# Patient Record
Sex: Male | Born: 1960 | Race: Black or African American | Hispanic: No | Marital: Married | Smoking: Never smoker
Health system: Southern US, Community
[De-identification: ages and names within clinical notes are randomized; demographics above are authoritative.]

## PROBLEM LIST (undated history)

## (undated) DIAGNOSIS — I10 Essential (primary) hypertension: Secondary | ICD-10-CM

## (undated) DIAGNOSIS — T7840XA Allergy, unspecified, initial encounter: Secondary | ICD-10-CM

## (undated) DIAGNOSIS — M1990 Unspecified osteoarthritis, unspecified site: Secondary | ICD-10-CM

## (undated) DIAGNOSIS — I83009 Varicose veins of unspecified lower extremity with ulcer of unspecified site: Secondary | ICD-10-CM

## (undated) DIAGNOSIS — G4733 Obstructive sleep apnea (adult) (pediatric): Secondary | ICD-10-CM

## (undated) DIAGNOSIS — E669 Obesity, unspecified: Secondary | ICD-10-CM

## (undated) DIAGNOSIS — L97909 Non-pressure chronic ulcer of unspecified part of unspecified lower leg with unspecified severity: Secondary | ICD-10-CM

## (undated) DIAGNOSIS — E785 Hyperlipidemia, unspecified: Secondary | ICD-10-CM

## (undated) HISTORY — DX: Allergy, unspecified, initial encounter: T78.40XA

## (undated) HISTORY — DX: Obstructive sleep apnea (adult) (pediatric): G47.33

## (undated) HISTORY — DX: Obesity, unspecified: E66.9

## (undated) HISTORY — DX: Varicose veins of unspecified lower extremity with ulcer of unspecified site: I83.009

## (undated) HISTORY — DX: Essential (primary) hypertension: I10

## (undated) HISTORY — DX: Unspecified osteoarthritis, unspecified site: M19.90

## (undated) HISTORY — PX: ROTATOR CUFF REPAIR: SHX139

## (undated) HISTORY — PX: SPINE SURGERY: SHX786

## (undated) HISTORY — PX: NECK SURGERY: SHX720

## (undated) HISTORY — DX: Hyperlipidemia, unspecified: E78.5

## (undated) HISTORY — DX: Non-pressure chronic ulcer of unspecified part of unspecified lower leg with unspecified severity: L97.909

---

## 2001-08-02 ENCOUNTER — Encounter: Payer: Self-pay | Admitting: *Deleted

## 2001-08-02 ENCOUNTER — Ambulatory Visit (HOSPITAL_COMMUNITY): Admission: RE | Admit: 2001-08-02 | Discharge: 2001-08-02 | Payer: Self-pay | Admitting: *Deleted

## 2002-03-20 ENCOUNTER — Ambulatory Visit (HOSPITAL_BASED_OUTPATIENT_CLINIC_OR_DEPARTMENT_OTHER): Admission: RE | Admit: 2002-03-20 | Discharge: 2002-03-20 | Payer: Self-pay | Admitting: *Deleted

## 2002-09-22 ENCOUNTER — Encounter: Payer: Self-pay | Admitting: Neurosurgery

## 2002-09-26 ENCOUNTER — Encounter: Payer: Self-pay | Admitting: Neurosurgery

## 2002-09-27 ENCOUNTER — Inpatient Hospital Stay (HOSPITAL_COMMUNITY): Admission: RE | Admit: 2002-09-27 | Discharge: 2002-09-29 | Payer: Self-pay | Admitting: Neurosurgery

## 2003-06-08 ENCOUNTER — Encounter: Payer: Self-pay | Admitting: *Deleted

## 2003-06-08 ENCOUNTER — Ambulatory Visit (HOSPITAL_COMMUNITY): Admission: RE | Admit: 2003-06-08 | Discharge: 2003-06-08 | Payer: Self-pay | Admitting: *Deleted

## 2009-07-22 ENCOUNTER — Ambulatory Visit: Payer: Self-pay | Admitting: Cardiovascular Disease

## 2009-07-26 ENCOUNTER — Telehealth: Payer: Self-pay | Admitting: Cardiovascular Disease

## 2009-09-05 ENCOUNTER — Ambulatory Visit: Payer: Self-pay | Admitting: Cardiovascular Disease

## 2009-09-18 ENCOUNTER — Encounter (INDEPENDENT_AMBULATORY_CARE_PROVIDER_SITE_OTHER): Payer: Self-pay | Admitting: *Deleted

## 2010-12-30 ENCOUNTER — Encounter: Payer: Self-pay | Admitting: Cardiovascular Disease

## 2011-03-17 NOTE — Letter (Signed)
July 22, 2009    Dr. Robert Bellow  [facility]  [address]  [place], [place] [address]   RE:  Kristopher Garcia, Kristopher Garcia  MRN:  [id number]  /  DOB:  July 22, 1961   Dear Dr. Perrin Maltese:   Thank you for the referral of your patient, Kristopher Garcia, to  Cardiology Clinic.  As you know he is a 50 year old male with  obstructive sleep apnea, morbid obesity, and poorly controlled  hypertension who recently saw you in clinic after not seeing a physician  for approximately 3 years' time.  The patient was found to have some EKG  changes compared with an EKG of 2006, and he was referred to our clinic.  Today in clinic the patient is still hypertensive at 173/105, although  he has only been taking his medications for approximately 2 days.  He is  not endorsing any symptoms consistent with angina.  However, he is very  inactive.  We discussed at length the lifestyle modifications including  medication compliance that will be necessary for him to improve his  cardiovascular status, and he seems willing to be an active participant  in these interventions.  Prior to instituting an exercise regimen, I  would like to order a treadmill stress test.  However, will need to wait  until his blood pressure is under better control to do so.  I have  instructed the patient to check his blood pressure twice a day for the  next week and to fax me these results.  If his blood pressure is within  a more acceptable range, we will order the stress test.  If not will  titrate the medications as necessary.  I look forward to following this  patient along with you, and I hope you are happy with this approach.  Please feel free to contact my office at any time with any questions or  concerns.    Sincerely,      Brayton El, MD  Electronically Signed    SGA/MedQ  DD: 07/22/2009  DT: 07/22/2009  Job #: [id number]

## 2011-03-17 NOTE — Assessment & Plan Note (Signed)
[facility] CARDIOLOGY OFFICE NOTE   NAME:Kristopher Garcia, Kristopher Garcia                     MRN:          [id number]  DATE:07/22/2009                            DOB:          08-31-1961    CHIEF COMPLAINT:  Hypertension.   HISTORY OF PRESENT ILLNESS:  Kristopher Garcia is a 50 year old black male  with past medical history significant for hypertension, obstructive  sleep apnea, morbid obesity, who is presenting for blood pressure  control and an abnormal EKG. The patient states for the past 3 years, he  has not seen a physician.  Recently, however, he took his blood pressure  and it was approximately 180/110.  This caused a great concern with the  patient and he saw a primary care physician, Dr. Perrin Maltese, last week in  clinic.  At that time, the patient was reinstituted on his previous  blood pressure medications as listed below.  Dr. Perrin Maltese noticed that the  patient had some changes in the contour of the ST segments in his EKG  compared with an EKG in 2006 and the patient was referred here for  further evaluation.  In speaking with the patient, he states that he is  not very active.  He jumps rope from time to time and can walk from his  vehicle to store and throughout the store without any difficulty.  He  does endorse very occasional twinges of chest discomfort that are not  associated with activity, are very brief, and are self-limiting.  He  denies any change in dyspnea on exertion in recent months.  The patient  does state he carries a history of obstructive sleep apnea and started  wearing his CPAP machine in the evenings for the past few days.  He  states a strong desire to become serious about addressing his health  issues.  He does endorse some lower extremity edema at the end of the  day.  He denies any PND, orthopnea, or syncope.   PAST MEDICAL HISTORY:  As above in HPI.  Also, the patient has a history  of surgery on his  C-spine.   SOCIAL HISTORY:  No tobacco.  No alcohol.  No drug use.  The patient  lives with his wife and two children, boy 41 and a girl 16.   FAMILY HISTORY:  Probably negative for premature coronary artery  disease, although he had a father who died at age 71 of an unknown  cause.   ALLERGIES:  No known drug allergies.   MEDICATIONS:  1. Norvasc 5 mg daily.  2. Benicar HCT 40/25 mg daily.  3. Multivitamin daily.  4. Vitamin D 2000 international units daily.  5. Coenzyme Q10 daily.   REVIEW OF SYSTEMS:  The patient endorses increased urination since  starting his antihypertensive medications.  He occasionally gets  soreness in his chest when lifting weights.  Other systems as in HPI,  otherwise negative.   PHYSICAL EXAMINATION:  VITAL SIGNS:  His blood pressure is 173/105  checked twice, pulse 87, and sating 97% on room air.  His weight is 335  pounds.  GENERAL:  No acute distress.  HEENT:  Normocephalic and atraumatic.  NECK:  Supple.  There is no JVD.  There are no carotid bruits.  HEART:  Regular rate and rhythm without murmur, rub, or gallop.  LUNGS:  Clear bilaterally.  ABDOMEN:  Soft and nontender.  EXTREMITIES:  Trace bilateral lower extremity edema.  Pulses 2+  bilateral carotid and radial pulses.  SKIN:  Warm and dry without rash.  NEUROLOGIC:  Nonfocal.  PSYCHIATRIC:  The patient is appropriate with normal levels of insight.  MUSCULOSKELETAL:  The patient has 5/5 bilateral upper and lower  extremity strength.   EKG from today independently interpreted by myself demonstrates normal  sinus rhythm with some baseline artifact.  There is T-wave flattening  and there is left ventricular hypertrophy.  Compared with EKG dated  July 19, 2009, T-wave is no longer inverted in the inferior leads.  An EKG dated October 2006 also shows inversion in the inferior leads.   Lab work is not currently available for review.  However, we have  requested the labs that were  drawn last week.   ASSESSMENT:  A 50 year old black male with poorly-controlled  hypertension, obstructive sleep apnea, and morbid obesity.   PLAN:  1. Hypertension.  The patient is currently not well controlled.      However, he has only been taking his medications for 2 days.  We      will ask the patient to keep a log of his blood pressures at home      checking it twice a day and fax [place] this log at the end of the week.      At that time, if he were need further antihypertensive control, we      would increase the Norvasc to 10 mg daily.  Of note, the patient      states that the Benicar HCT may be too expensive for him, therefore      when he runs out of his prescription at the end of the month, we      may consider switching him to lisinopril/hydrochlorothiazide      combination in its place.  2. Obstructive sleep apnea.  The patient is encouraged to be compliant      with his CPAP and is informed that as obstructive sleep apnea is      treated, his blood pressure should also improve.  3. EKG changes.  The patient does have EKG changes in the ST-segment      and T-waves in the inferior lateral leads.  At this point, this is      of unclear significance.  The patient is not having any clear      symptoms consistent with angina.  Because we would like to start      him on an exercise routine, I would like to order a stress test on      him.  We will await the results of his blood pressures over the      next week to decide on the timing of the stress test as his blood      pressure must be under better control before it is undertaken.  4. Obesity.  The patient is counseled regarding improve dietary      control.  Though he states that this is somewhat difficult as his      wife does the shopping and that the healthier foods tend to be more      expensive.  Once the patient  undergoes an exercise stress test, we      will encourage an exercise routine with him performing some type  of      aerobic activity for at least 30 minutes everyday of the week.  The      patient seems to be very receptive to instituting this regimen.  We      will await the patient's blood pressure readings over the next week      and determine the next appropriate step of his treatment based on      these.     Brayton El, MD  Electronically Signed    SGA/MedQ  DD: 07/22/2009  DT: 07/23/2009  Job #: [id number]

## 2011-03-17 NOTE — Assessment & Plan Note (Signed)
[facility] CARDIOLOGY OFFICE NOTE   NAME:Hirschman, CHRISANGEL ESKENAZI                     MRN:          [id number]  DATE:09/05/2009                            DOB:          June 11, 1961    PROBLEM LIST:  1. Poorly controlled hypertension.  2. Obesity.  3. Obstructive sleep apnea.   INTERVAL HISTORY:  Since his last visit, the patient underwent a  exercise echocardiogram that was poor quality study.  However, there was  no evidence of inducible ischemia.  Since that time, the patient states  he has been exercising almost on a daily basis for approximately 30  minutes at a time.  He thinks he has lost approximately 8 pounds.  During these episodes of exercise, he does not experience any chest  discomfort or significant amount of shortness of breath.  He ran out of  his Benicar/HCT approximately 2 weeks ago and is requesting for  medication as he is unable to afford it.  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   REVIEW OF SYSTEMS:  As above, otherwise negative.   PHYSICAL EXAMINATION:  VITAL SIGNS:  The patient has a blood pressure of  158/99, pulse is 74, he weighs 330 pounds, he is sating which is 4  pounds less than he weighed at the end of September, and he is sating  97% on room air.  GENERAL:  He is in no acute distress.  HEENT:  Nonfocal.  Normocephalic, atraumatic.  NECK:  Supple.  There is no JVD.  There are no carotid bruits.  HEART:  Regular rate and rhythm without murmur, rub, or gallop.  LUNGS:  Clear to auscultation bilaterally.  ABDOMEN:  Soft, nontender, nondistended.  EXTREMITIES:  Have a trace without bilateral lower extremity edema.  PSYCHIATRIC:  The patient is appropriate with normal levels of insight  and review of the patient's dobutamine echocardiogram showed an ejection  fraction that was preserved.  He only completed 1 minute into Bruce  protocol, however, he states he could have gone further he was stopped  as his heart rate reached a 5% maximal predicted heart rate within that  1 minute.  The patient also had a hypertensive blood pressure response.   ASSESSMENT AND PLAN:  1. Hypertension.  He should continue on Norvasc 10 mg daily.  We will      start him on HCTZ/lisinopril 12.5/20 mg daily.  He has really been      written a prescription for this medication by his primary care      physician, who was also following up with a BMP.  2. Obstructive sleep apnea.  The patient should continue on CPAP.  3. Obesity.  The patient should continue with his exercise routine.      He has been successful in      losing weight and he had loosed.  The patient also make efforts to      improve his diet and to      intake fewer calories.  We will see the patient back  in 4 months'      time unless a problem were to arise in the interim.     Brayton El, MD  Electronically Signed    SGA/MedQ  DD: 09/05/2009  DT: 09/06/2009  Job #: [id number]

## 2011-03-20 NOTE — Letter (Signed)
August 16, 2009    Mr. Kristopher Garcia  [address]  [place], [place]  [address]   RE:  Kristopher Garcia, Kristopher Garcia  MRN:  [id number]  /  DOB:  10/21/1961   Dear [name]:   We have been unable to reach by phone.  I am writing to you regarding  the results of your stress test performed on August 01, 2009.  Your  heart's pumping function was completely within normal limits and it  appeared to improve appropriately with exercise.  Your blood pressure,  however, was elevated during exercise.  I would like to discuss further  blood pressure treatment with you as elevated blood pressure is a  significant risk factor for developing coronary disease in the future.  Please contact our office at [phone] at your earliest convenience.    Sincerely,      Brayton El, MD  Electronically Signed    SGA/MedQ  DD: 08/16/2009  DT: 08/16/2009  Job #: [id number]

## 2011-03-20 NOTE — Op Note (Signed)
NAME:  Kristopher Garcia, Kristopher Garcia                        ACCOUNT NO.:  [id number]   MEDICAL RECORD NO.:  [id number]                   PATIENT TYPE:  INP   LOCATION:  NA                                   FACILITY:  [facility]   PHYSICIAN:  Clydene Fake, M.D.               DATE OF BIRTH:  04/30/1961   DATE OF PROCEDURE:  09/26/2002  DATE OF DISCHARGE:                                 OPERATIVE REPORT   PREOPERATIVE DIAGNOSES:  Cervical spondylosis, cord compression, and  myelopathy.   POSTOPERATIVE DIAGNOSES:  Cervical spondylosis, cord compression, and  myelopathy.   PROCEDURES:  Intracervical corpectomy C5 and C6 with an anterior cervical  decompression and diskectomy and fusion of C3 through C4; planned fusion  from C3 through C7 with SynMesh cage at C4 through C7 with autograft bone  and structural allograft bone at C3-4 with a Premier anterior cervical plate  from C3 through C7; microdissection with microscope.   SURGEON:  Clydene Fake, M.D.   ASSISTANT:  Hewitt Shorts, M.D.   ANESTHESIA:  General endotracheal anesthesia.   ESTIMATED BLOOD LOSS:  400 cc   BLOOD REPLACED:  None.   DRAINS:  None.   COMPLICATIONS:  None.   REASON FOR PROCEDURE:  The patient is a 50 year old head wound over the last  year and on and off neck pain, but lately he has had a lot more.  The pain  in his neck extends into the right shoulder and sometimes down the right arm  at the deltoid area and triceps.  He does have some increased pain with  extension.  The MRI was done showing severe spinal cord compression; he has  some canal stenosis of the cervical spine at multiple levels.  The patient  was found to be mildly myopathic on exam, complete clonus and a little  trouble with tandem gait, and a little slowed right deltoid movements.  The  patient admitted for cervical decompression and cervical corpectomies.   PROCEDURE IN DETAIL:  The patient was brought to the operating room, general  anesthesia was induced, and the patient placed in Gardner-Wells traction  tongs for cervical traction; 10 pounds of traction was used.  The neck was  prepped and draped in the usual sterile fashion.  An incision was then made  over the anterior border of the sternocleidomastoid muscle after injecting  the area with 10 cc of 1% lidocaine with epinephrine.  The incision was  taken to the platysma; hemostasis was obtained with Bovie cauterization.  The platysma incised with the Bovie and blunt dissection was taken through  the anterior cervical fascia to the anterior cervical spine.  A needle was  placed in a disk space and x-ray done showing this was the 4-5 interspace.  We then continued our dissection so that 5-6 and 6-7 spaces were exposed  along with 3-4 disk spaces.  The longus colli muscle was reflected  bilaterally to each side to a distal length and self-retaining retractor  system was placed and centered over the dissected disk space.  Distraction  pins were placed in C4 and C7, and the disk spaces of C4-5, C5-6, and C6-7  were incised with a #15 blade and partial diskectomy was performed with  pituitary rongeurs.  The distraction pins were placed and the spine was  distracted over this length from C4 to C7.  Then we continued with  diskectomies with pituitary rongeurs and then corpectomies of C5 and C6  areas were performed with Leksell rongeurs and osteophyte reamers and to  remove the bulk of the intervertebral body.  All bone was shaved, cleaned,  and chopped into small pieces for use later in the case.  The microscope was  brought in for microdissection.  A high-speed drill was used to drill and  straighten out the lateral margins and to remove all the bone down to the  remnant of the posterior cortex and ligaments posteriorly.  At this point  curettes were used to remove the rest of the disk material in the disk  spaces and to remove the cartilaginous endplates from the C4 and  C7  vertebral bodies.  Then 1 to 2-mm Kerrison punches were then used to remove  the rest of the disk material with some posterior osteophytes at posterior  ligament throughout from C4 through C7, decompressing the spinal canal.  Bilateral foraminotomies were performed at each level and decompression of  the nerve roots as they came out. When we were finished, we had good  decompression of the central sac and bilateral foraminotomies done at 4-5, 5-  6, and 6-7.  A high-speed drill was used to remove the cartilaginous  endplates at C4 and C7, and then the depth of intervertebral bodies were  measured and found to be 16 mm deep.  We then measured the distance from C4  to C7 with a caliper and cut a SynMesh 12-mm cage to the appropriate length,  put the endplates on, and then packed the cage with the autograft bone  removed from the intervertebral bodies.  We then tapped this cage into  place, countersinking it a couple of mm; there was plenty of room posterior  between the cage and the spinal cord and dura.  We then released the  distraction and removed the distraction pin from the C7, and then tested the  cage again; it was firmly in place.  We removed the self-retraining  retractor up to near the C3-4 level and we placed a distraction pin up in  the C3 and distracted it over the 3-4 interspace after incising the disk  space.  Diskectomy was performed with pituitary rongeurs and curettes.  A  high-speed drill was used to perform the diskectomy and remove the  cartilaginous endplate.  22-mm Kerrison punches were used to remove disk  herniation and to remove posterior osteophytes and decompress the spinal  cord, along with bilateral foraminotomies.  When we had good decompression,  the wound was irrigated a antibiotic solution. The intervertebral bodies  were measured with a depth guage and we put a 6 mm Tutogen bone which was the depth shorter than the depth of the intervertebral bodies; it  was then  tapped into place and then countersunk a couple of mm. We tested with the  nerve hook behind the graft and there was plenty of room between graft and  dura.  Distraction pins were removed.  Hemostasis was  obtained with Gelfoam  and thrombin.  Gelfoam and thrombin was then removed.  Premier anterior  cervical plates were then placed over the cervical spine and two screws  placed in the C7, two into C4, and two into C3.  Lateral x-ray was obtained  showing good position of the plate and the screws at C3 and C4; we could not  see the screws at C7. We had a good bone graft at 3-4 and the cage looked in  good position at the superior aspect of it, but could not see the cage as  the butt at C7 due to large shoulders and large biceps of the patient.  Intraoperatively, the position images looked fine.  The wound was irrigated  with antibiotic solution.  The locking mechanism was then pushed up in the  appropriate position and all the screws tightened.  With good hemostasis,  the wound was irrigated with antibiotic solution again, and the platysma was  closed with 3-0 Vicryl interrupted suture, the subcutaneous tissue was  closed with the same and the skin was closed with Benzoin and Steri-Strips.  Dressing was placed.  The patient was placed into an Aspen cervical collar,  awakened from anesthesia, and transferred to the recovery room in stable  condition.                                               Clydene Fake, M.D.    JRH/MEDQ  D:  09/26/2002  T:  09/27/2002  Job:  [id number]

## 2011-03-20 NOTE — Discharge Summary (Signed)
NAME:  Kristopher Garcia, Kristopher Garcia NO.:  [id number]   MEDICAL RECORD NO.:  [id number]                   PATIENT TYPE:  INP   LOCATION:  3028                                 FACILITY:  [facility]   PHYSICIAN:  Clydene Fake, M.D.               DATE OF BIRTH:  [birth year]   DATE OF ADMISSION:  09/27/2002  DATE OF DISCHARGE:  09/29/2002                                 DISCHARGE SUMMARY   ADMISSION DIAGNOSES:  Cervical spondylosis, cord compression, myelopathy.   DISCHARGE DIAGNOSIS:  Cervical spondylosis, cord compression, myelopathy.   PROCEDURES:  Cervical corpectomy at C5 and C6 and anterior cervical  decompression and diskectomy at C4 with fusion of C3 through C7 with cage  from 4-7 and allograft of bone at C3-4 with autograft bone and placement of  sintered mesh from anterior incision, anterior cervical plate at C3 and  microscope for microdissection.   REASON FOR ADMISSION:  The patient is a 50 year old gentleman who has had on  and off neck pain over the last year or two, but over the last few months,  it has been warm with pain extending to the right shoulder and down the  right arm into the deltoid and triceps area.  He was found to have trouble  with tandem gait and smooth rapid alternating movements.  MRI was done  showing significant spondylosis, cord compression from cervical stenosis.  The patient is brought in for decompression.   HOSPITAL COURSE:  The patient was admitted on the day of surgery and  underwent the procedure noted above without complications.  Postoperatively,  the patient was transferred to recovery and then to stepdown intensive care  unit.  He had done well.  He was moving all four extremities well.  Voice  was okay.  Incision remained clean, dry, and intact.  He started eating.  He  was transferred to the floor where he continued to do well, started  ambulating.  He had a slight increase in his trouble with swallowing, but he  continued to progress.  On 09/29/2002, it was felt the patient may have a  little improvement in rapid alternating movements but definitely no  worsening in neurologic function.  He is able to eat, has a little  difficulty swallowing large things, but that is to be expected.  Incision is  healing well.  The patient will be discharged home in stable condition.   DISCHARGE MEDICATIONS:  As pre-hospitalization plus Vicodin ES 1 to 2 p.o.  q.4-6h. p.r.n. and Flexeril p.r.n. spasms.   DIET:  As tolerated.   FOLLOW UP:  Followup will be in three weeks in my office.  Clydene Fake, M.D.    JRH/MEDQ  D:  09/29/2002  T:  09/29/2002  Job:  [id number]

## 2011-04-15 ENCOUNTER — Encounter: Payer: Self-pay | Admitting: Cardiovascular Disease

## 2012-06-14 ENCOUNTER — Other Ambulatory Visit: Payer: Self-pay | Admitting: Cardiology

## 2012-06-16 NOTE — H&P (Signed)
Office Visit     Patient: Kristopher Garcia, Kristopher Garcia Provider: Cynthia Garcia. Ferguson, NP  DOB: 02/08/1961   Age: 51 Y   Sex: Male Date: 06/13/2012  Phone: [phone]  Address: [address] , [place], [place]-[address]  Pcp: ROBERT READE    --------------------------------------------------------------------------------  Subjective:    CC:      1. TT/abnormal Stress test f/u.      HPI:     General:           Mr Vandermeulen is Garcia 51 yo male followed by Dr Turner with Garcia hx of evaluation of abnormal EKG and to get cardiac clearance for right shoulder surgery. He is going to have right shoulder surgery and went in for preop clearance and was noted to have some nonspecific T wave changes. He says that he was told in the past that his EKG was abnormal and actually was seen by [facility] and had Garcia stress test and was normal. Repeat nuclear stress test 06/10/12 was abnormal with apical ischemia. He denies any chest pain, SOB, palpitations, dizziness or syncope. He occasionally will get some LE edema due to varicose veins especially when standing alot. He wears support hose to help. He walks on his job and says that he can walk several blocks with no symptoms..         Patient denies chest pain, palpitations, dizziness, syncope, nor PND.     ROS:      as noted in HPI, + chronic right shoulder pain since last October no GI complaints no black or bloody BMS, no abdominal pain, no fever, chills, appetite stable, no neurological changes, no allergy to seafood nor IVP dye.     Medical History: HTN, OA, various jts, Obesity, OSA, Dyslipidemia, obstructive sleep apnea (SPLIT 05/06/12 ESS 8, AHI 19/hr REM 46/hr, RDI 29/hr REM 60/hr, O2 min 77%; APAP 9-17 recommended).      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      Hospitalization/Major Diagnostic Procedure: see above .      Family History:  Father: deceased heart attack(age54) Mother: deceased  stroke(age 60), lung cancer, diabetes Paternal Grand Father: deceased Paternal Grand Mother: deceased Maternal Grand Father: deceased Maternal Grand Mother: deceased diabetes      Social History:      General: History of smoking  cigarettes:  Never smoked. no Smoking. no Alcohol. no Recreational drug use. no Exercise. Marital Status: married. Children: Jonovan & Victoria. Religion: yes, Non Denominational. Seat belt use: yes.      Medications: Amlodipine Besylate 10 MG Tablet 1 tablet Once Garcia day, Tekturna HCT 300-25 MG Tablet 1 tablet Once Garcia day, Percocet 5-325 MG Tablet 1 tablet every 6 hrs prn pain, Medication List reviewed and reconciled with the patient     Allergies: Latex Gloves: itchy hands.      Objective:    Vitals: Wt 300.6, Wt change -4.6 lb, Ht 72.75, BMI 39.93, Pulse sitting 78, BP sitting 130/86.     Examination:     Cardiology, General:         GENERAL APPEARANCE: pleasant, NAD.  HEENT: unremarkable.  CAROTID UPSTROKE: normal, no bruit.  JVD: flat.  HEART SOUNDS: regular, normal S1, S2, no S3 or S4.  MURMUR: absent.  LUNGS: no rales or wheezes.  ABDOMEN: soft, non tender, positive bowel sounds, no masses felt.  EXTREMITIES: no leg edema.  PERIPHERAL PULSES: 2 plus bilateral.              Assessment:    Assessment:  1. Essential hypertension, benign - 401.1 (Primary)   2. Encounter for pre-operative cardiovascular clearance - V72.81   3. Abnormal cardiovascular stress test - 794.39     Plan:    1. Essential hypertension, benign  Continue Amlodipine Besylate Tablet, 10 MG, 1 tablet, Orally, Once Garcia day ;  Continue Tekturna HCT Tablet, 300-25 MG, 1 tablet, Orally, Once Garcia day .       2. Abnormal cardiovascular stress test        LAB: Basic Metabolic     GLUCOSE 91 70-99 - mg/dL        BUN 16 6-26 - mg/dL        CREATININE 1.25 0.60-1.30 - mg/dl        eGFR (NON-AFRICAN AMERICAN) 61 >60 - calc        eGFR (AFRICAN AMERICAN) 73 >60 - calc        SODIUM 140 136-145 - mmol/L          POTASSIUM 3.6 3.5-5.5 - mmol/L        CHLORIDE 102 98-107 - mmol/L        C02 32 22-32 - mg/dL        ANION GAP 9.7 6.0-20.0 - mmol/L        CALCIUM 9.8 8.6-10.3 - mg/dL               FERGUSON,CYNTHIA Garcia 06/13/2012 06:20:09 PM > ok for cath        LAB: CBC with Diff      WBC 3.6 4.0-11.0 - K/ul L       RBC 4.59 4.20-5.80 - M/uL        HGB 13.8 13.0-17.0 - g/dL        HCT 42.6 39.0-52.0 - %        MCH 30.0 27.0-33.0 - pg        MPV 7.5 7.5-10.7 - fL        MCV 92.8 80.0-94.0 - fL        MCHC 32.3 32.0-36.0 - g/dL        RDW 14.4 11.5-15.5 - %        NRBC# 0.01 -        PLT 225 150-400 - K/uL        NEUT % 45.9 43.3-71.9 - %        NRBC% 0.30 - %        LYMPH% 38.2 16.8-43.5 - %        MONO % 9.6 4.6-12.4 - %        EOS % 5.3 0.0-7.8 - %        BASO % 1.0 0.0-1.0 - %         NEUT # 1.6 1.9-7.2 - K/uL L       LYMPH# 1.40 1.10-2.70 - K/uL        MONO # 0.3 0.3-0.8 - K/uL        EOS # 0.2 0.0-0.6 - K/uL        BASO # 0.0 0.0-0.1 - K/uL               FERGUSON,CYNTHIA Garcia 06/13/2012 05:53:32 PM > ok for cath        LAB: PT and PTT (020321)     aPTT 28 24-33 - SEC        INR 1.0 0.8-1.2 -        Prothrombin Time 10.8 9.1-12.0 - SEC                 FERGUSON,CYNTHIA Garcia 06/14/2012 08:33:24 AM > ok for cath   Risks and benefits of cardiac catheterization have been reviewed including risk of stroke, heart attack, death, bleeding, renal impariment and arterial damage. There was ample oppurtuny to answer questions. Alternatives were discussed. Patient understands and wishes to proceed. Patient aggreable and will arrange cardiac cath with Dr Skains hopefully by Radial approach.          Immunizations:       Labs:      Procedure Codes: 80048 ECL BMP, 85025 ECL CBC PLATELET DIFF, 36415 BLOOD COLLECTION ROUTINE VENIPUNCTURE     Preventive:           Follow Up: TT post cath (Reason: S/P cath)        Provider: Cynthia Garcia. Ferguson, NP  Patient: Kristopher Garcia, Emett Garcia  DOB: 04/27/1961   Date: 06/13/2012   

## 2012-06-17 ENCOUNTER — Encounter (HOSPITAL_COMMUNITY): Payer: Self-pay | Admitting: Pharmacy Technician

## 2012-06-21 ENCOUNTER — Encounter (HOSPITAL_COMMUNITY)
Admission: RE | Disposition: A | Discharge status: Home / Self Care | Payer: Self-pay | Source: Ambulatory Visit | Attending: Cardiology

## 2012-06-21 ENCOUNTER — Ambulatory Visit (HOSPITAL_COMMUNITY)
Admission: RE | Admit: 2012-06-21 | Discharge: 2012-06-21 | Disposition: A | Discharge status: Home / Self Care | Payer: BC Managed Care – PPO | Source: Ambulatory Visit | Attending: Cardiology | Admitting: Cardiology

## 2012-06-21 DIAGNOSIS — R9439 Abnormal result of other cardiovascular function study: Secondary | ICD-10-CM | POA: Insufficient documentation

## 2012-06-21 DIAGNOSIS — Z01810 Encounter for preprocedural cardiovascular examination: Secondary | ICD-10-CM | POA: Insufficient documentation

## 2012-06-21 DIAGNOSIS — M25519 Pain in unspecified shoulder: Secondary | ICD-10-CM | POA: Insufficient documentation

## 2012-06-21 DIAGNOSIS — I10 Essential (primary) hypertension: Secondary | ICD-10-CM | POA: Insufficient documentation

## 2012-06-21 HISTORY — PX: LEFT HEART CATHETERIZATION WITH CORONARY ANGIOGRAM: SHX5451

## 2012-06-21 HISTORY — PX: CARDIAC CATHETERIZATION: SHX172

## 2012-06-21 SURGERY — LEFT HEART CATHETERIZATION WITH CORONARY ANGIOGRAM
Anesthesia: LOCAL

## 2012-06-21 MED ORDER — HEPARIN SODIUM (PORCINE) 1000 UNIT/ML IJ SOLN
INTRAMUSCULAR | Status: AC
  Filled 2012-06-21: qty 1

## 2012-06-21 MED ORDER — SODIUM CHLORIDE 0.9 % IJ SOLN: 3.0000 mL | INTRAMUSCULAR | Status: DC | PRN

## 2012-06-21 MED ORDER — SODIUM CHLORIDE 0.9 % IV SOLN: 250.0000 mL | INTRAVENOUS | Status: DC | PRN

## 2012-06-21 MED ORDER — NITROGLYCERIN 0.2 MG/ML ON CALL CATH LAB
INTRAVENOUS | Status: AC
  Filled 2012-06-21: qty 1

## 2012-06-21 MED ORDER — VERAPAMIL HCL 2.5 MG/ML IV SOLN
INTRAVENOUS | Status: AC
  Filled 2012-06-21: qty 2

## 2012-06-21 MED ORDER — ONDANSETRON HCL 4 MG/2ML IJ SOLN: 4.0000 mg | Freq: Four times a day (QID) | INTRAMUSCULAR | Status: DC | PRN

## 2012-06-21 MED ORDER — DIAZEPAM 5 MG PO TABS
5.0000 mg | ORAL_TABLET | ORAL | Status: AC
  Administered 2012-06-21: 5 mg via ORAL
  Filled 2012-06-21: qty 1

## 2012-06-21 MED ORDER — FENTANYL CITRATE 0.05 MG/ML IJ SOLN
INTRAMUSCULAR | Status: AC
  Filled 2012-06-21: qty 2

## 2012-06-21 MED ORDER — MIDAZOLAM HCL 2 MG/2ML IJ SOLN
INTRAMUSCULAR | Status: AC
  Filled 2012-06-21: qty 2

## 2012-06-21 MED ORDER — SODIUM CHLORIDE 0.9 % IJ SOLN: 3.0000 mL | Freq: Two times a day (BID) | INTRAMUSCULAR | Status: DC

## 2012-06-21 MED ORDER — SODIUM CHLORIDE 0.9 % IV SOLN
INTRAVENOUS | Status: DC
  Administered 2012-06-21: 1000 mL via INTRAVENOUS

## 2012-06-21 MED ORDER — ASPIRIN 81 MG PO CHEW
324.0000 mg | CHEWABLE_TABLET | ORAL | Status: AC
  Administered 2012-06-21: 324 mg via ORAL
  Filled 2012-06-21: qty 4

## 2012-06-21 MED ORDER — LIDOCAINE HCL (PF) 1 % IJ SOLN
INTRAMUSCULAR | Status: AC
  Filled 2012-06-21: qty 30

## 2012-06-21 MED ORDER — ACETAMINOPHEN 325 MG PO TABS: 650.0000 mg | ORAL_TABLET | ORAL | Status: DC | PRN

## 2012-06-21 MED ORDER — SODIUM CHLORIDE 0.9 % IV SOLN
1.0000 mL/kg/h | INTRAVENOUS | Status: AC
Start: 2012-06-21 — End: 2012-06-21
  Administered 2012-06-21: 1 mL/kg/h via INTRAVENOUS

## 2012-06-21 MED ORDER — HEPARIN (PORCINE) IN NACL 2-0.9 UNIT/ML-% IJ SOLN
INTRAMUSCULAR | Status: AC
  Filled 2012-06-21: qty 2000

## 2012-06-21 NOTE — H&P (View-Only) (Signed)
Office Visit     Patient: Kristopher Garcia, Kristopher Garcia Provider: Michaell Cowing. Emelda Fear, NP  DOB: 09-Dec-1960   Age: 52 Y   Sex: Male Date: 06/13/2012  Phone: [phone]  Address: [address] , [place], [id number]  Pcp: ROBERT READE    --------------------------------------------------------------------------------  Subjective:    CC:      1. TT/abnormal Stress test f/u.      HPI:     General:           Mr Herda is a 51 yo male followed by Dr Mayford Knife with a hx of evaluation of abnormal EKG and to get cardiac clearance for right shoulder surgery. He is going to have right shoulder surgery and went in for preop clearance and was noted to have some nonspecific T wave changes. He says that he was told in the past that his EKG was abnormal and actually was seen by a Cardiology and had a stress test and was normal. Repeat nuclear stress test 06/10/12 was abnormal with apical ischemia. He denies any chest pain, SOB, palpitations, dizziness or syncope. He occasionally will get some LE edema due to varicose veins especially when standing alot. He wears support hose to help. He walks on his job and says that he can walk several blocks with no symptoms..         Patient denies chest pain, palpitations, dizziness, syncope, nor PND.     ROS:      as noted in HPI, + chronic right shoulder pain since last October no GI complaints no black or bloody BMS, no abdominal pain, no fever, chills, appetite stable, no neurological changes, no allergy to seafood nor IVP dye.     Medical History: HTN, OA, various jts, Obesity, OSA, Dyslipidemia, obstructive sleep apnea (SPLIT 05/06/12 ESS 8, AHI 19/hr REM 46/hr, RDI 29/hr REM 60/hr, O2 min 77%; APAP 9-17 recommended).      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      Hospitalization/Major Diagnostic Procedure: see above .      Family History:  Father: deceased heart attack(age54) Mother: deceased  stroke(age 60), lung cancer, diabetes Paternal Grand Father: deceased Paternal Grand Mother: deceased Maternal Grand Father: deceased Maternal Grand Mother: deceased diabetes      Social History:      General: History of smoking  cigarettes:  Never smoked. no Smoking. no Alcohol. no Recreational drug use. no Exercise. Marital Status: married. Children: Cyd Silence. Religion: yes, Non Denominational. Seat belt use: yes.      Medications: Amlodipine Besylate 10 MG Tablet 1 tablet Once a day, Tekturna HCT 300-25 MG Tablet 1 tablet Once a day, Percocet 5-325 MG Tablet 1 tablet every 6 hrs prn pain, Medication List reviewed and reconciled with the patient     Allergies: Latex Gloves: itchy hands.      Objective:    Vitals: Wt 300.6, Wt change -4.6 lb, Ht 72.75, BMI 39.93, Pulse sitting 78, BP sitting 130/86.     Examination:     Cardiology, General:         GENERAL APPEARANCE: pleasant, NAD.  HEENT: unremarkable.  CAROTID UPSTROKE: normal, no bruit.  JVD: flat.  HEART SOUNDS: regular, normal S1, S2, no S3 or S4.  MURMUR: absent.  LUNGS: no rales or wheezes.  ABDOMEN: soft, non tender, positive bowel sounds, no masses felt.  EXTREMITIES: no leg edema.  PERIPHERAL PULSES: 2 plus bilateral.  Assessment:    Assessment:  1. Essential hypertension, benign - 401.1 (Primary)   2. Encounter for pre-operative cardiovascular clearance - V72.81   3. Abnormal cardiovascular stress test - 794.39     Plan:    1. Essential hypertension, benign  Continue Amlodipine Besylate Tablet, 10 MG, 1 tablet, Orally, Once a day ;  Continue Tekturna HCT Tablet, 300-25 MG, 1 tablet, Orally, Once a day .       2. Abnormal cardiovascular stress test        LAB: Basic Metabolic     GLUCOSE 91 70-99 - mg/dL        BUN 16 2-95 - mg/dL        CREATININE 6.21 0.60-1.30 - mg/dl        eGFR (NON-AFRICAN AMERICAN) 61 >60 - calc        eGFR (AFRICAN AMERICAN) 73 >60 - calc        SODIUM 140 136-145 - mmol/L          POTASSIUM 3.6 3.5-5.5 - mmol/L        CHLORIDE 102 98-107 - mmol/L        C02 32 22-32 - mg/dL        ANION GAP 9.7 3.0-86.5 - mmol/L        CALCIUM 9.8 8.6-10.3 - mg/dL               FERGUSON,CYNTHIA A 06/13/2012 06:20:09 PM > ok for cath        LAB: CBC with Diff      WBC 3.6 4.0-11.0 - K/ul L       RBC 4.59 4.20-5.80 - M/uL        HGB 13.8 13.0-17.0 - g/dL        HCT 78.4 69.6-29.5 - %        MCH 30.0 27.0-33.0 - pg        MPV 7.5 7.5-10.7 - fL        MCV 92.8 80.0-94.0 - fL        MCHC 32.3 32.0-36.0 - g/dL        RDW 28.4 13.2-44.0 - %        NRBC# 0.01 -        PLT 225 150-400 - K/uL        NEUT % 45.9 43.3-71.9 - %        NRBC% 0.30 - %        LYMPH% 38.2 16.8-43.5 - %        MONO % 9.6 4.6-12.4 - %        EOS % 5.3 0.0-7.8 - %        BASO % 1.0 0.0-1.0 - %         NEUT # 1.6 1.9-7.2 - K/uL L       LYMPH# 1.40 1.10-2.70 - K/uL        MONO # 0.3 0.3-0.8 - K/uL        EOS # 0.2 0.0-0.6 - K/uL        BASO # 0.0 0.0-0.1 - K/uL               FERGUSON,CYNTHIA A 06/13/2012 05:53:32 PM > ok for cath        LAB: PT and PTT ([id number])     aPTT 28 24-33 - SEC        INR 1.0 0.8-1.2 -        Prothrombin Time 10.8 9.1-12.0 - SEC  FERGUSON,CYNTHIA A 06/14/2012 08:33:24 AM > ok for cath   Risks and benefits of cardiac catheterization have been reviewed including risk of stroke, heart attack, death, bleeding, renal impariment and arterial damage. There was ample oppurtuny to answer questions. Alternatives were discussed. Patient understands and wishes to proceed. Patient aggreable and will arrange cardiac cath with Dr Anne Fu hopefully by Radial approach.          Immunizations:       Labs:      Procedure Codes: 19147 ECL BMP, 85025 ECL CBC PLATELET DIFF, 82956 BLOOD COLLECTION ROUTINE VENIPUNCTURE     Preventive:           Follow Up: TT post cath (Reason: S/P cath)        Provider: Michaell Cowing. Emelda Fear, NP  Patient: Kristopher Garcia, Kristopher Garcia  DOB: Jan 04, 1961   Date: 06/13/2012

## 2012-06-21 NOTE — CV Procedure (Signed)
CARDIAC CATHETERIZATION  PROCEDURE:  Left heart catheterization with selective coronary angiography, left ventriculogram via the radial artery approach.  INDICATIONS:  51 year old male with upcoming right shoulder surgery who underwent nuclear stress test by Dr. Mayford Knife who showed apical reversible defect, possible soft tissue attenuation, EKG with T wave inversions. Catheterization done to exclude ischemia.  The risks, benefits, and details of the procedure were explained to the patient, including possibilities of stroke, heart attack, death, renal impairment, arterial damage, bleeding.  The patient verbalized understanding and wanted to proceed.  Informed written consent was obtained.  PROCEDURE TECHNIQUE:  Allen's test was performed pre-and post procedure and was normal. The right radial artery site was prepped and draped in a sterile fashion. One percent lidocaine was used for local anesthesia. Using the modified Seldinger technique a 5 French hydrophilic sheath was inserted into the radial artery without difficulty. 3 mg of verapamil was administered via the sheath. A Judkins right #4 catheter with the guidance of a Versicore wire was placed in the right coronary cusp and it was difficult to obtain the ostium of the RCA.. A no torque catheter was also utilized unsuccessful. An EZ RAD right catheter was the closest at cannulating and provided adequate visualization nonselectively. His arch was challenging to traverse and deep breathing was necessary to extend catheters. Difficult to cannulate arteries.  After traversing the aortic arch, 5000 units of heparin IV was administered. A Judkins left #3.5 catheter was used to selectively cannulate the left main artery. Multiple views with hand injection of Omnipaque were obtained. Catheter a pigtail catheter was used to cross into the left ventricle, hemodynamics were obtained, and a left ventriculogram was performed in the RAO position with power injection.   200 mcg of intra-sheath nitroglycerin was administered prior to sheath pull. Following the procedure, sheath was removed, patient was hemodynamically stable, hemostasis was maintained with a Terumo T band.   CONTRAST:  Total of 235 ml.    FLOUROSCOPY TIME: 17.6 min.  COMPLICATIONS:  None.    HEMODYNAMICS:  Aortic pressure was 145/87 mmHg; LV systolic pressure was ; LVEDP .  There was no gradient between the left ventricle and aorta.    ANGIOGRAPHIC DATA:    Left main: No angiographically significant disease dominant vessel.  Left anterior descending (LAD): Vessel has one significant diagonal branch, wraps around the apex and supplies the last one third of the inferior wall.  Circumflex artery (CIRC): 3 obtuse marginal branches. No significant abnormalities. Dominant vessel giving rise to posterior descending artery.  Right coronary artery (RCA): Small, nondominant vessel. Nonselective reviewed which shows no evidence of proximal vessel disease or mid vessel disease.  LEFT VENTRICULOGRAM:  Left ventricular angiogram was done in the 30 RAO projection and revealed normal left ventricular wall motion and systolic function with an estimated ejection fraction of 65%.   IMPRESSIONS:  No angiographically significant CAD Normal left ventricular systolic function.  LVEDP 23  mmHg.  Ejection fraction 65%.  RECOMMENDATION:  Reassuring. He may proceed with shoulder surgery.

## 2012-06-21 NOTE — Interval H&P Note (Signed)
History and Physical Interval Note:  06/21/2012 9:00 AM  Kristopher Garcia  has presented today for surgery, with the diagnosis of Chest pain  The various methods of treatment have been discussed with the patient and family. After consideration of risks, benefits and other options for treatment, the patient has consented to  Procedure(s) (LRB): LEFT HEART CATHETERIZATION WITH CORONARY ANGIOGRAM (N/A) as a surgical intervention .  The patient's history has been reviewed, patient examined, no change in status, stable for surgery.  I have reviewed the patient's chart and labs.  Questions were answered to the patient's satisfaction.     SKAINS, MARK  I have discussed cardiac catheterization with Mr. Catanzaro. Apical ischemia noted on stress test. Catheterization being done at request of Dr. Mayford Knife. Discussed possible complications including stroke, heart attack, death, renal impairment, limb damage. If intervention is necessary, BMS due to upcoming shoulder surgery.

## 2014-04-27 ENCOUNTER — Ambulatory Visit (INDEPENDENT_AMBULATORY_CARE_PROVIDER_SITE_OTHER): Payer: BC Managed Care – PPO | Admitting: Family Medicine

## 2014-04-27 VITALS — BP 132/84 | HR 76 | Temp 98.0°F | Resp 16 | Ht 72.0 in | Wt 281.6 lb

## 2014-04-27 DIAGNOSIS — L02619 Cutaneous abscess of unspecified foot: Secondary | ICD-10-CM

## 2014-04-27 DIAGNOSIS — S81809A Unspecified open wound, unspecified lower leg, initial encounter: Secondary | ICD-10-CM

## 2014-04-27 DIAGNOSIS — S81801A Unspecified open wound, right lower leg, initial encounter: Secondary | ICD-10-CM

## 2014-04-27 DIAGNOSIS — S81001A Unspecified open wound, right knee, initial encounter: Secondary | ICD-10-CM

## 2014-04-27 DIAGNOSIS — S81009A Unspecified open wound, unspecified knee, initial encounter: Secondary | ICD-10-CM

## 2014-04-27 DIAGNOSIS — L03039 Cellulitis of unspecified toe: Secondary | ICD-10-CM

## 2014-04-27 DIAGNOSIS — S91009A Unspecified open wound, unspecified ankle, initial encounter: Secondary | ICD-10-CM

## 2014-04-27 DIAGNOSIS — L03031 Cellulitis of right toe: Secondary | ICD-10-CM

## 2014-04-27 DIAGNOSIS — R609 Edema, unspecified: Secondary | ICD-10-CM

## 2014-04-27 DIAGNOSIS — R600 Localized edema: Secondary | ICD-10-CM

## 2014-04-27 DIAGNOSIS — S91001A Unspecified open wound, right ankle, initial encounter: Secondary | ICD-10-CM

## 2014-04-27 MED ORDER — AMOXICILLIN-POT CLAVULANATE 875-125 MG PO TABS: 1.0000 | ORAL_TABLET | Freq: Two times a day (BID) | ORAL | Status: DC

## 2014-04-27 MED ORDER — FUROSEMIDE 20 MG PO TABS: 20.0000 mg | ORAL_TABLET | Freq: Every day | ORAL | Status: DC

## 2014-04-27 NOTE — Patient Instructions (Addendum)
Elastics therapy:  [phone]  Elevate legs  Recheck one week

## 2014-04-27 NOTE — Progress Notes (Signed)
Subjective:    Patient ID: Kristopher Garcia, male    DOB: 1961/03/20, 53 y.o.   MRN: [id number]  HPI Chief Complaint  Patient presents with   Leg Injury    right leg, blood shot out of ankle?   This chart was scribed for Elvina SidleKurt Lauenstein, MD by Andrew Auaven Small, ED Scribe. This patient was seen in room 5 and the patient's care was started at 9:18 AM.  HPI Comments: Kristopher Garcia is a 53 y.o. male who presents to the [facility] complaining of right leg pain. States he had bleeding to right inner ankle while in the shower this morning. He reports this is the second time and that the first time it happened was 4 weeks ago. Pt believes he may have bumped his ankle. Pt reports h/o of right ankle sprain 2 years ago and has had issue with ankle since. Pt denies wearing compression sock and taking fluid pill. Pt is on an apap machine prescribed by Dr. Particia Lathersbourne. Pt states he has lost over 30 lb since seeing Dr. Particia Lathersbourne.   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 Pt states he stands on his feet all day.   There are no active problems to display for this patient.  Past Medical History  Diagnosis Date   Hypertension    Obesity    Obstructive sleep apnea    Allergy    No Active Allergies Prior to Admission medications   Medication Sig Start Date End Date Taking? Authorizing Provider  Aliskiren-Hydrochlorothiazide (TEKTURNA HCT) 300-25 MG TABS Take 1 tablet by mouth daily.     Historical Provider, MD  amLODipine (NORVASC) 10 MG tablet Take 10 mg by mouth daily.      Historical Provider, MD  oxyCODONE-acetaminophen (PERCOCET/ROXICET) 5-325 MG per tablet Take 1 tablet by mouth every 4 (four) hours as needed. For pain    Historical Provider, MD   Review of Systems  Cardiovascular: Positive for leg swelling.  Skin: Positive for color change and wound.    Objective:   Physical Exam  Nursing note and vitals reviewed. Constitutional: He is oriented to person, place, and time. He appears  well-developed and well-nourished. No distress.  HENT:  Head: Normocephalic and atraumatic.  Eyes: Conjunctivae and EOM are normal.  Neck: Neck supple. No tracheal deviation present.  Cardiovascular: Normal rate, regular rhythm and normal heart sounds.  Exam reveals no gallop and no friction rub.   No murmur heard. Pulmonary/Chest: Effort normal and breath sounds normal. No respiratory distress. He has no wheezes. He has no rales.  Musculoskeletal: Normal range of motion.  4+ pedal edema extended to knee.  Neurological: He is alert and oriented to person, place, and time.  Skin: Skin is warm and dry.  Oozing scarred tissue on medial right lower leg.   Psychiatric: He has a normal mood and affect. His behavior is normal.      Assessment & Plan:   1. Cellulitis of toe of right foot    Meds ordered this encounter  Medications   amoxicillin-clavulanate (AUGMENTIN) 875-125 MG per tablet    Sig: Take 1 tablet by mouth 2 (two) times daily.    Dispense:  20 tablet    Refill:  0   furosemide (LASIX) 20 MG tablet    Sig: Take 1 tablet (20 mg total) by mouth daily.    Dispense:  30 tablet    Refill:  3   Patient has a problem with the edema which is  causing cellulitis of the right lower extremity. He also has what appears to be a broken blood vessel which was sealed with Dermabond on the inside of his right ankle.  Please elevate the legs, 2 Lasix, stand or work for couple days, and recheck in a week.  Signed, Sheila OatsKurt Lauenstein M.D.

## 2014-05-04 ENCOUNTER — Ambulatory Visit (INDEPENDENT_AMBULATORY_CARE_PROVIDER_SITE_OTHER): Payer: BC Managed Care – PPO | Admitting: Internal Medicine

## 2014-05-04 VITALS — BP 146/80 | HR 90 | Temp 98.2°F | Resp 18 | Ht 72.0 in | Wt 291.0 lb

## 2014-05-04 DIAGNOSIS — G8929 Other chronic pain: Secondary | ICD-10-CM

## 2014-05-04 DIAGNOSIS — M25571 Pain in right ankle and joints of right foot: Secondary | ICD-10-CM

## 2014-05-04 DIAGNOSIS — I10 Essential (primary) hypertension: Secondary | ICD-10-CM

## 2014-05-04 DIAGNOSIS — M25579 Pain in unspecified ankle and joints of unspecified foot: Secondary | ICD-10-CM

## 2014-05-04 DIAGNOSIS — Z125 Encounter for screening for malignant neoplasm of prostate: Secondary | ICD-10-CM

## 2014-05-04 DIAGNOSIS — Z6835 Body mass index (BMI) 35.0-35.9, adult: Secondary | ICD-10-CM | POA: Insufficient documentation

## 2014-05-04 LAB — LIPID PANEL
CHOLESTEROL: 164 mg/dL (ref 0–200)
HDL: 50 mg/dL (ref >39)
LDL Cholesterol: 104 mg/dL — ABNORMAL HIGH (ref 0–99)
TRIGLYCERIDES: 50 mg/dL (ref <150)
Total CHOL/HDL Ratio: 3.3 Ratio
VLDL: 10 mg/dL (ref 0–40)

## 2014-05-04 LAB — COMPREHENSIVE METABOLIC PANEL
ALK PHOS: 56 U/L (ref 39–117)
ALT: 16 U/L (ref 0–53)
AST: 21 U/L (ref 0–37)
Albumin: 4.2 g/dL (ref 3.5–5.2)
BILIRUBIN TOTAL: 0.6 mg/dL (ref 0.2–1.2)
BUN: 17 mg/dL (ref 6–23)
CO2: 27 meq/L (ref 19–32)
Calcium: 9.5 mg/dL (ref 8.4–10.5)
Chloride: 103 mEq/L (ref 96–112)
Creat: 1.17 mg/dL (ref 0.50–1.35)
GLUCOSE: 91 mg/dL (ref 70–99)
Potassium: 4 mEq/L (ref 3.5–5.3)
SODIUM: 139 meq/L (ref 135–145)
TOTAL PROTEIN: 6.7 g/dL (ref 6.0–8.3)

## 2014-05-04 LAB — CBC WITH DIFFERENTIAL/PLATELET
BASOS ABS: 0 10*3/uL (ref 0.0–0.1)
BASOS PCT: 1 % (ref 0–1)
EOS ABS: 0.2 10*3/uL (ref 0.0–0.7)
EOS PCT: 6 % — AB (ref 0–5)
HEMATOCRIT: 42.5 % (ref 39.0–52.0)
HEMOGLOBIN: 14.3 g/dL (ref 13.0–17.0)
Lymphocytes Relative: 24 % (ref 12–46)
Lymphs Abs: 0.9 10*3/uL (ref 0.7–4.0)
MCH: 29.8 pg (ref 26.0–34.0)
MCHC: 33.6 g/dL (ref 30.0–36.0)
MCV: 88.5 fL (ref 78.0–100.0)
MONO ABS: 0.4 10*3/uL (ref 0.1–1.0)
MONOS PCT: 9 % (ref 3–12)
NEUTROS ABS: 2.3 10*3/uL (ref 1.7–7.7)
Neutrophils Relative %: 60 % (ref 43–77)
Platelets: 222 10*3/uL (ref 150–400)
RBC: 4.8 MIL/uL (ref 4.22–5.81)
RDW: 14 % (ref 11.5–15.5)
WBC: 3.9 10*3/uL — ABNORMAL LOW (ref 4.0–10.5)

## 2014-05-04 LAB — POCT GLYCOSYLATED HEMOGLOBIN (HGB A1C): Hemoglobin A1C: 5.3

## 2014-05-04 LAB — TSH: TSH: 0.621 u[IU]/mL (ref 0.350–4.500)

## 2014-05-04 LAB — PSA: PSA: 0.73 ng/mL (ref <=4.00)

## 2014-05-04 MED ORDER — AMLODIPINE BESYLATE 5 MG PO TABS: 5.0000 mg | ORAL_TABLET | Freq: Every day | ORAL | Status: DC

## 2014-05-04 NOTE — Progress Notes (Signed)
Subjective:    Patient ID: Kristopher Garcia, male    DOB: 09-10-1961, 53 y.o.   MRN: [id number]  This chart was scribed for Ellamae Siaobert Doolittle, MD by Jarvis Morganaylor Ferguson, Medical Scribe. This patient was seen in Room 3 and the patient's care was started at 8:34 AM.  HPI HPI Comments: Kristopher Garcia is a 53 y.o. male who presents to the [facility] for a recheck from last week. [facility] and was seen by Dr. Elvina SidleKurt Lauenstein. Patient had an open wound on his right lower leg with a possible infection. Patient was prescribed with Augmentin and Lasix. He was also told to use a compression sock to help with the swelling. Patient still has some swelling in his right lower leg but there is no longer any active bleeding. Patient states that he has a history of an ankle sprain that occurred 2 years ago that never got better. He states that it still seems to be causing him some trouble and making it difficult to walk  Patient states that he needs to find a primary care doctor because he does not have one in the area. He states that it can sometimes be difficult with how much he works. Patient denies any prior history of DM or HTN.  There are no active problems to display for this patient.  Past Medical History  Diagnosis Date  . Hypertension   . Obesity   . Obstructive sleep apnea   . Allergy    Past Surgical History  Procedure Laterality Date  . Neck surgery    . Spine surgery     No Active Allergies Prior to Admission medications   Medication Sig Start Date End Date Taking? Authorizing Provider  amoxicillin-clavulanate (AUGMENTIN) 875-125 MG per tablet Take 1 tablet by mouth 2 (two) times daily. 04/27/14  Yes Elvina SidleKurt Lauenstein, MD  furosemide (LASIX) 20 MG tablet Take 1 tablet (20 mg total) by mouth daily. 04/27/14  Yes Elvina SidleKurt Lauenstein, MD  oxyCODONE-acetaminophen (PERCOCET/ROXICET) 5-325 MG per tablet Take 1 tablet by mouth every 4 (four) hours as needed.  For pain    Historical Provider, MD   History   Social History  . Marital Status: Married    Spouse Name: N/A    Number of Children: N/A  . Years of Education: N/A   Occupational History  . Not on file.   Social History Main Topics  . Smoking status: Never Smoker   . Smokeless tobacco: Not on file  . Alcohol Use: No  . Drug Use: No  . Sexual Activity: Not on file   Other Topics Concern  . Not on file   Social History Narrative  . No narrative on file     Review of Systems  Constitutional: Negative for fever.  Gastrointestinal: Negative for nausea and vomiting.  Musculoskeletal:       Swelling in his lower right leg  Skin: Positive for wound (right lower leg). Negative for rash.  All other systems reviewed and are negative.      Objective:   Physical Exam  Nursing note and vitals reviewed. Constitutional: He is oriented to person, place, and time. He appears well-developed and well-nourished. No distress.  HENT:  Head: Normocephalic and atraumatic.  Eyes: Conjunctivae and EOM are normal.  Neck: Neck supple.  Cardiovascular: Normal rate.   Pulmonary/Chest: Effort normal. No respiratory distress.  Musculoskeletal: Normal range of motion. He exhibits edema.  There is no redness or  tenderness around the ankle at this point. There are a few small areas that are not completely healed and leave an open wound but not a clear vascular ulcer. He continues to have 2-3+ pitting edema over this lower extremity. The ankle has lost some flexibility. He has a flat arch  Neurological: He is alert and oriented to person, place, and time.  Skin: Skin is warm and dry.  Psychiatric: He has a normal mood and affect. His behavior is normal.    Filed Vitals:   05/04/14 0822  BP: 146/80  Pulse: 90  Temp: 98.2 F (36.8 C)  Resp: 18    A1C 5.3      Assessment & Plan:  I have completed the patient encounter in its entirety as documented by the scribe, with editing by me where  necessary. Robert P. Merla Richesoolittle, M.D.  Essential hypertension - Plan: CBC with Differential, POCT glycosylated hemoglobin (Hb A1C), Comprehensive metabolic panel, Lipid panel  Morbid obesity - Plan: TSH  Screening for prostate cancer - Plan: PSA   cellulitis lower extremity with edema resolving--continue Lasix /recheck at the end of the month   Consider starting hydrochlorothiazide at that point   He was given information about establish an appointment for routine preventative care and his first labs were drawn today  Chronic foot and ankle problem--- deferred to Dr. Victorino DikeHewitt for evaluation

## 2014-05-04 NOTE — Patient Instructions (Signed)
Call for appointment [phone] to start routine care with one of our doctors---Dr Conley RollsLe, Dr Clelia CroftShaw, Dr Neva SeatGreene, Dr Patsy Lageropland

## 2014-05-08 ENCOUNTER — Encounter: Payer: Self-pay | Admitting: Internal Medicine

## 2014-05-20 ENCOUNTER — Ambulatory Visit (INDEPENDENT_AMBULATORY_CARE_PROVIDER_SITE_OTHER): Payer: BC Managed Care – PPO | Admitting: Family Medicine

## 2014-05-20 VITALS — BP 172/90 | HR 90 | Temp 98.7°F | Resp 18 | Ht 72.5 in | Wt 282.0 lb

## 2014-05-20 DIAGNOSIS — I10 Essential (primary) hypertension: Secondary | ICD-10-CM

## 2014-05-20 DIAGNOSIS — R609 Edema, unspecified: Secondary | ICD-10-CM

## 2014-05-20 DIAGNOSIS — I87009 Postthrombotic syndrome without complications of unspecified extremity: Secondary | ICD-10-CM

## 2014-05-20 DIAGNOSIS — R600 Localized edema: Secondary | ICD-10-CM

## 2014-05-20 MED ORDER — LISINOPRIL 20 MG PO TABS: 20.0000 mg | ORAL_TABLET | Freq: Every day | ORAL | Status: DC

## 2014-05-20 NOTE — Progress Notes (Signed)
This chart was scribed for Elvina Sidle, MD by Ardelia Mems, Scribe. This patient was seen in room 8 and the patient's care was started at 8:35 AM.  @[facility] @  Patient ID: Kristopher Garcia MRN: [id number], DOB: May 19, 1961, 53 y.o. Date of Encounter: 05/20/2014, 8:51 AM  Primary Physician: No PCP Per Patient  Chief Complaint: Follow-up, right ankle  HPI: 53 y.o. year old male with a history below presents for a recheck and evaluation of chronic skin changes in his right ankle. He states that he has been wearing compression socks with some relief. He states that he has been able to ambulate normally, but that he has been having some soreness. He reports feeling well in general, with no other complaints or symptoms.  He states that he was started on Norvasc at his last visit here. His blood pressure taken here today was 172/90. He states that he has no allergies.   Works at [employer]  Past Medical History  Diagnosis Date  . Hypertension   . Obesity   . Obstructive sleep apnea   . Allergy      Home Meds: Prior to Admission medications   Medication Sig Start Date End Date Taking? Authorizing Provider  amLODipine (NORVASC) 5 MG tablet Take 1 tablet (5 mg total) by mouth daily. 05/04/14  Yes Tonye Pearson, MD  furosemide (LASIX) 20 MG tablet Take 1 tablet (20 mg total) by mouth daily. 04/27/14  Yes Elvina Sidle, MD    Allergies: No Known Allergies  History   Social History  . Marital Status: Married    Spouse Name: N/A    Number of Children: N/A  . Years of Education: N/A   Occupational History  . Not on file.   Social History Main Topics  . Smoking status: Never Smoker   . Smokeless tobacco: Not on file  . Alcohol Use: No  . Drug Use: No  . Sexual Activity: Not on file   Other Topics Concern  . Not on file   Social History Narrative  . No narrative on file     Review of Systems: Constitutional: negative for chills, fever, night sweats, weight changes, or  fatigue  HEENT: negative for vision changes, hearing loss, congestion, rhinorrhea, ST, epistaxis, or sinus pressure Cardiovascular: negative for chest pain or palpitations Respiratory: negative for hemoptysis, wheezing, shortness of breath, or cough Abdominal: negative for abdominal pain, nausea, vomiting, diarrhea, or constipation Dermatological: negative for rash Neurologic: negative for headache, dizziness, or syncope All other systems reviewed and are otherwise negative with the exception to those above and in the HPI.   Physical Exam: Blood pressure 172/90, pulse 90, temperature 98.7 F (37.1 C), resp. rate 18, height 6' 0.5" (1.842 m), weight 282 lb (127.914 kg), SpO2 98.00%., Body mass index is 37.7 kg/(m^2). General: Well developed, well nourished, in no acute distress. Head: Normocephalic, atraumatic, eyes without discharge, sclera non-icteric, nares are without discharge. Bilateral auditory canals clear, TM's are without perforation, pearly grey and translucent with reflective cone of light bilaterally. Oral cavity moist, posterior pharynx without exudate, erythema, peritonsillar abscess, or post nasal drip.  Neck: Supple. No thyromegaly. Full ROM. No lymphadenopathy. Lungs: Clear bilaterally to auscultation without wheezes, rales, or rhonchi. Breathing is unlabored. Heart: RRR with S1 S2. No murmurs, rubs, or gallops appreciated. Abdomen: Soft, non-tender, non-distended with normoactive bowel sounds. No hepatomegaly. No rebound/guarding. No obvious abdominal masses. Msk:  Strength and tone normal for age. Extremities/Skin: Warm and dry. No clubbing or cyanosis. No edema.  No rashes or suspicious lesions. Neuro: Alert and oriented X 3. Moves all extremities spontaneously. Gait is normal. CNII-XII grossly in tact. Psych:  Responds to questions appropriately with a normal affect.      ASSESSMENT AND PLAN:  53 y.o. year old male with Essential hypertension - Plan: lisinopril  (PRINIVIL,ZESTRIL) 20 MG tablet  Pedal edema - Plan: lisinopril (PRINIVIL,ZESTRIL) 20 MG tablet  Postphlebitic disease - Plan: lisinopril (PRINIVIL,ZESTRIL) 20 MG tablet  CPE in sept/oct   Signed, Elvina SidleKurt Brown Dunlap, MD 05/20/2014 8:51 AM

## 2014-07-20 ENCOUNTER — Encounter (HOSPITAL_BASED_OUTPATIENT_CLINIC_OR_DEPARTMENT_OTHER): Payer: BC Managed Care – PPO | Attending: General Surgery

## 2014-07-20 DIAGNOSIS — L97909 Non-pressure chronic ulcer of unspecified part of unspecified lower leg with unspecified severity: Secondary | ICD-10-CM | POA: Diagnosis not present

## 2014-07-20 DIAGNOSIS — I872 Venous insufficiency (chronic) (peripheral): Secondary | ICD-10-CM | POA: Insufficient documentation

## 2014-07-21 NOTE — Progress Notes (Signed)
[facility]  NAME:  Kristopher Garcia, Kristopher Garcia NO.:  [id number]  MEDICAL RECORD NO.:  [id number]      DATE OF BIRTH:  1961-06-14  PHYSICIAN:  Ardath Sax, M.D.           VISIT DATE:                                  OFFICE VISIT   This is a 53 year old male who was sent to [place] by Dr. Victorino Dike because of what looks like a venous stasis ulcer that is about 5 to 6 cm in diameter on the medial aspect of his right ankle.  He does not have diabetes.  He works full times and is on his feet quite a bit.  He does have a history of hypertension and he is on amlodipine and Lasix.  When he came here today, he was found to have a blood pressure of 172/90, pulse 73, temp 98.  He weighs 276 pounds.  He has what looks like a venous stasis ulcer.  He has excellent pulses.  I put him in an Unna boot wrap with a collagen dressing and we called vascular to give this man of venous study.  I think he would be helped greatly with venous ablation of his saphenous vein which leads right to this ulcer.  So, his diagnosis is  1. Venous stasis ulcer, right leg. 2. Probably in need of venous ablation. 3. Obesity. 4. Hypertension.     Ardath Sax, M.D.     PP/MEDQ  D:  07/20/2014  T:  07/21/2014  Job:  [id number]

## 2014-07-27 DIAGNOSIS — L97909 Non-pressure chronic ulcer of unspecified part of unspecified lower leg with unspecified severity: Secondary | ICD-10-CM | POA: Diagnosis not present

## 2014-07-27 DIAGNOSIS — I872 Venous insufficiency (chronic) (peripheral): Secondary | ICD-10-CM | POA: Diagnosis not present

## 2014-08-02 ENCOUNTER — Encounter: Payer: Self-pay | Admitting: General Surgery

## 2014-08-02 DIAGNOSIS — G4733 Obstructive sleep apnea (adult) (pediatric): Secondary | ICD-10-CM

## 2014-08-03 ENCOUNTER — Encounter (HOSPITAL_BASED_OUTPATIENT_CLINIC_OR_DEPARTMENT_OTHER): Payer: BC Managed Care – PPO | Attending: General Surgery

## 2014-08-03 DIAGNOSIS — L97919 Non-pressure chronic ulcer of unspecified part of right lower leg with unspecified severity: Secondary | ICD-10-CM | POA: Insufficient documentation

## 2014-08-03 DIAGNOSIS — I87331 Chronic venous hypertension (idiopathic) with ulcer and inflammation of right lower extremity: Secondary | ICD-10-CM | POA: Insufficient documentation

## 2014-08-10 ENCOUNTER — Ambulatory Visit (HOSPITAL_COMMUNITY)
Admission: RE | Admit: 2014-08-10 | Discharge: 2014-08-10 | Disposition: A | Discharge status: Home / Self Care | Payer: BC Managed Care – PPO | Source: Ambulatory Visit | Attending: Internal Medicine | Admitting: Internal Medicine

## 2014-08-10 ENCOUNTER — Other Ambulatory Visit (HOSPITAL_COMMUNITY): Payer: Self-pay | Admitting: General Surgery

## 2014-08-10 DIAGNOSIS — I87331 Chronic venous hypertension (idiopathic) with ulcer and inflammation of right lower extremity: Secondary | ICD-10-CM | POA: Diagnosis not present

## 2014-08-10 DIAGNOSIS — I872 Venous insufficiency (chronic) (peripheral): Secondary | ICD-10-CM | POA: Diagnosis present

## 2014-08-10 DIAGNOSIS — L97919 Non-pressure chronic ulcer of unspecified part of right lower leg with unspecified severity: Secondary | ICD-10-CM | POA: Diagnosis not present

## 2014-08-17 DIAGNOSIS — L97919 Non-pressure chronic ulcer of unspecified part of right lower leg with unspecified severity: Secondary | ICD-10-CM | POA: Diagnosis not present

## 2014-08-17 DIAGNOSIS — I87331 Chronic venous hypertension (idiopathic) with ulcer and inflammation of right lower extremity: Secondary | ICD-10-CM | POA: Diagnosis not present

## 2014-08-22 ENCOUNTER — Other Ambulatory Visit: Payer: Self-pay | Admitting: Family Medicine

## 2014-08-24 DIAGNOSIS — L97919 Non-pressure chronic ulcer of unspecified part of right lower leg with unspecified severity: Secondary | ICD-10-CM | POA: Diagnosis not present

## 2014-08-24 DIAGNOSIS — I87331 Chronic venous hypertension (idiopathic) with ulcer and inflammation of right lower extremity: Secondary | ICD-10-CM | POA: Diagnosis not present

## 2014-08-31 ENCOUNTER — Encounter: Payer: Self-pay | Admitting: Vascular Surgery

## 2014-08-31 DIAGNOSIS — I87331 Chronic venous hypertension (idiopathic) with ulcer and inflammation of right lower extremity: Secondary | ICD-10-CM | POA: Diagnosis not present

## 2014-08-31 DIAGNOSIS — L97919 Non-pressure chronic ulcer of unspecified part of right lower leg with unspecified severity: Secondary | ICD-10-CM | POA: Diagnosis not present

## 2014-09-03 ENCOUNTER — Ambulatory Visit (INDEPENDENT_AMBULATORY_CARE_PROVIDER_SITE_OTHER): Payer: BC Managed Care – PPO | Admitting: Vascular Surgery

## 2014-09-03 ENCOUNTER — Encounter: Payer: Self-pay | Admitting: Vascular Surgery

## 2014-09-03 ENCOUNTER — Other Ambulatory Visit: Payer: Self-pay | Admitting: *Deleted

## 2014-09-03 VITALS — BP 187/106 | HR 85 | Resp 16 | Ht 73.0 in | Wt 294.0 lb

## 2014-09-03 DIAGNOSIS — L97909 Non-pressure chronic ulcer of unspecified part of unspecified lower leg with unspecified severity: Secondary | ICD-10-CM

## 2014-09-03 DIAGNOSIS — I83018 Varicose veins of right lower extremity with ulcer other part of lower leg: Secondary | ICD-10-CM

## 2014-09-03 DIAGNOSIS — I83013 Varicose veins of right lower extremity with ulcer of ankle: Secondary | ICD-10-CM

## 2014-09-03 DIAGNOSIS — I83015 Varicose veins of right lower extremity with ulcer other part of foot: Secondary | ICD-10-CM

## 2014-09-03 DIAGNOSIS — I83009 Varicose veins of unspecified lower extremity with ulcer of unspecified site: Secondary | ICD-10-CM | POA: Insufficient documentation

## 2014-09-03 DIAGNOSIS — I83012 Varicose veins of right lower extremity with ulcer of calf: Secondary | ICD-10-CM

## 2014-09-03 DIAGNOSIS — I83019 Varicose veins of right lower extremity with ulcer of unspecified site: Secondary | ICD-10-CM

## 2014-09-03 DIAGNOSIS — I83014 Varicose veins of right lower extremity with ulcer of heel and midfoot: Secondary | ICD-10-CM

## 2014-09-03 DIAGNOSIS — L97919 Non-pressure chronic ulcer of unspecified part of right lower leg with unspecified severity: Principal | ICD-10-CM

## 2014-09-03 DIAGNOSIS — I83011 Varicose veins of right lower extremity with ulcer of thigh: Secondary | ICD-10-CM

## 2014-09-03 HISTORY — DX: Varicose veins of unspecified lower extremity with ulcer of unspecified site: L97.909

## 2014-09-03 HISTORY — DX: Varicose veins of unspecified lower extremity with ulcer of unspecified site: I83.009

## 2014-09-03 NOTE — Progress Notes (Signed)
Subjective:     Patient ID: Kristopher PattersonJames A Bowditch, male   DOB: 1960-11-25, 53 y.o.   MRN: [id number]  HPIthis 53 year old male was referred by Dr. Ardath SaxPeter Parker at the wound center for evaluation of the large venous stasis ulcer in the right ankle. Patient states the ulcer started 6 months ago and gradually enlarged. He has been visiting the wound center for 2 months with weekly Unna boot changes with very little change in the size of the ulcer. There has been some bleeding on occasion. He has no history of DVT or thrombophlebitis. He has no history of stasis ulcers in the left leg. He previously underwent what sounds like laser ablation of both legs about 10 years ago at [facility]. He states the left leg was successful but the right leg was not.  Past Medical History  Diagnosis Date  . Hypertension   . Obesity   . Obstructive sleep apnea   . Allergy   . OA (osteoarthritis)   . Dyslipidemia     History  Substance Use Topics  . Smoking status: Never Smoker   . Smokeless tobacco: Never Used  . Alcohol Use: No    Family History  Problem Relation Age of Onset  . Cancer Mother [id number]    lung cancer    No Known Allergies  Current outpatient prescriptions: aspirin 500 MG EC tablet, Take 500 mg by mouth every 6 (six) hours as needed for pain., Disp: , Rfl: ;  furosemide (LASIX) 20 MG tablet, TAKE 1 TABLET (20 MG TOTAL) BY MOUTH DAILY., Disp: 30 tablet, Rfl: 2;  lisinopril (PRINIVIL,ZESTRIL) 20 MG tablet, Take 1 tablet (20 mg total) by mouth daily., Disp: 90 tablet, Rfl: 3  BP 187/106 mmHg  Pulse 85  Resp 16  Ht 6\' 1"  (1.854 m)  Wt 294 lb (133.358 kg)  BMI 38.80 kg/m2  Body mass index is 38.8 kg/(m^2).           Review of SystemsPatient has obstructive sleep apnea, dyspnea on exertion, distal edema, denies chest pain, hemoptysis, orthopnea, claudication. Other systems negative and a complete review of systems other than morbid obesity     Objective:   Physical Exam BP  187/106 mmHg  Pulse 85  Resp 16  Ht 6\' 1"  (1.854 m)  Wt 294 lb (133.358 kg)  BMI 38.80 kg/m2  Gen.-alert and oriented x3 in no apparent distress-obese HEENT normal for age Lungs no rhonchi or wheezing Cardiovascular regular rhythm no murmurs carotid pulses 3+ palpable no bruits audible Abdomen soft nontender no palpable masses Musculoskeletal free of  major deformities Skin clear -no rashes Neurologic normal Lower extremities 3+ femoral and dorsalis pedis pulses palpable bilaterally with 1+ edema bilaterally Right leg with extensive hyperpigmentation lower third of leg with large ulcer measuring 5 x 3 cm near the medial malleolus. Excellent arterial pulses bilaterally. Chronic 1+ edema right leg. Left leg with hyperpigmentation lower third of leg but no active ulcer. Excellent arterial pulses 3+ dorsalis pedis.  Patient had venous duplex exam on 08/10/2014 which I have reviewed and interpreted. He has gross reflux in the right great saphenous vein from the distal thigh to near the saphenofemoral junction. This supplies the bulging varicosities. There is also severe deep reflux on the right leg. Left leg has severe reflux in the deep vein and the great saphenous vein has been successfully closed but there is some filling of the anterior accessory branch with reflux.       Assessment:  Nonhealing extensive ulcer right ankle due to gross reflux right superficial and deep venous systems-documented gross reflux right great saphenous vein with also painful varicosities and right thigh Ulcer has been treated with extrinsic compression stockingsUnna boots for 2 months and vein center with no success and ulcer has been present for 6 months     Plan:     Patient needs laser ablation right great saphenous vein +10-20 stab phlebectomy of painful varicosities as soon as possible to help facilitate healing of this ulcer. We will proceed with precertification to perform this in the near future for  this nice man

## 2014-09-07 ENCOUNTER — Encounter: Payer: Self-pay | Admitting: Vascular Surgery

## 2014-09-07 ENCOUNTER — Encounter (HOSPITAL_BASED_OUTPATIENT_CLINIC_OR_DEPARTMENT_OTHER): Payer: BC Managed Care – PPO | Attending: General Surgery

## 2014-09-07 DIAGNOSIS — I87331 Chronic venous hypertension (idiopathic) with ulcer and inflammation of right lower extremity: Secondary | ICD-10-CM | POA: Insufficient documentation

## 2014-09-07 DIAGNOSIS — L97819 Non-pressure chronic ulcer of other part of right lower leg with unspecified severity: Secondary | ICD-10-CM | POA: Insufficient documentation

## 2014-09-10 ENCOUNTER — Encounter: Payer: Self-pay | Admitting: Vascular Surgery

## 2014-09-10 ENCOUNTER — Ambulatory Visit (INDEPENDENT_AMBULATORY_CARE_PROVIDER_SITE_OTHER): Payer: BC Managed Care – PPO | Admitting: Vascular Surgery

## 2014-09-10 VITALS — BP 195/123 | HR 70 | Resp 16 | Ht 73.0 in | Wt 288.0 lb

## 2014-09-10 DIAGNOSIS — I83012 Varicose veins of right lower extremity with ulcer of calf: Secondary | ICD-10-CM

## 2014-09-10 DIAGNOSIS — I83014 Varicose veins of right lower extremity with ulcer of heel and midfoot: Secondary | ICD-10-CM

## 2014-09-10 DIAGNOSIS — L97919 Non-pressure chronic ulcer of unspecified part of right lower leg with unspecified severity: Principal | ICD-10-CM

## 2014-09-10 DIAGNOSIS — I83018 Varicose veins of right lower extremity with ulcer other part of lower leg: Secondary | ICD-10-CM

## 2014-09-10 DIAGNOSIS — I83013 Varicose veins of right lower extremity with ulcer of ankle: Secondary | ICD-10-CM

## 2014-09-10 DIAGNOSIS — I83011 Varicose veins of right lower extremity with ulcer of thigh: Secondary | ICD-10-CM

## 2014-09-10 DIAGNOSIS — I83019 Varicose veins of right lower extremity with ulcer of unspecified site: Secondary | ICD-10-CM

## 2014-09-10 DIAGNOSIS — I83015 Varicose veins of right lower extremity with ulcer other part of foot: Secondary | ICD-10-CM

## 2014-09-10 NOTE — Progress Notes (Signed)
   Laser Ablation Procedure      Date: 09/10/2014    Kristopher Garcia DOB:1961/03/10  Consent signed: Yes  Surgeon:J.D. Hart RochesterLawson  Procedure: Laser Ablation: right Greater Saphenous Vein  BP 195/123 mmHg  Pulse 70  Resp 16  Ht 6\' 1"  (1.854 m)  Wt 288 lb (130.636 kg)  BMI 38.01 kg/m2  Start time: 9:05   End time: 10:30  Tumescent Anesthesia: 475 cc 0.9% NaCl with 50 cc Lidocaine HCL with 1% Epi and 15 cc 8.4% NaHCO3  Local Anesthesia: 8 cc Lidocaine HCL and NaHCO3 (ratio 2:1)  Pulsed mode: 15 watts, 500ms delay, 1.0 duration  Total energy: 1556, Total pulses: 104, Total time: 1:44     Stab Phlebectomy: 10-20 Sites: Thigh and Calf  Patient tolerated procedure well: Yes  Notes:   Description of Procedure:  After marking the course of the secondary varicosities, the patient was placed on the operating table in the supine position, and the right leg was prepped and draped in sterile fashion.   Local anesthetic was administered and under ultrasound guidance the saphenous vein was accessed with a micro needle and guide wire; then the mirco puncture sheath was place.  A guide wire was inserted saphenofemoral junction , followed by a 5 french sheath.  The position of the sheath and then the laser fiber below the junction was confirmed using the ultrasound.  Tumescent anesthesia was administered along the course of the saphenous vein using ultrasound guidance. The patient was placed in Trendelenburg position and protective laser glasses were placed on patient and staff, and the laser was fired at 15 watt pulsed mode advancing 1-2 mm per sec for a total of 1556 joules.   For stab phlebectomies, local anesthetic was administered at the previously marked varicosities, and tumescent anesthesia was administered around the vessels.  Ten to 20 stab wounds were made using the tip of an 11 blade. And using the vein hook, the phlebectomies were performed using a hemostat to avulse the varicosities.   Adequate hemostasis was achieved.     Steri strips were applied to the stab wounds and ABD pads and thigh high compression stockings were applied.  Ace wrap bandages were applied over the phlebectomy sites and at the top of the saphenofemoral junction. Blood loss was less than 15 cc.  The patient ambulated out of the operating room having tolerated the procedure well.

## 2014-09-10 NOTE — Progress Notes (Signed)
Subjective:     Patient ID: Kristopher Garcia, male   DOB: 08-May-1961, 53 y.o.   MRN: [id number]  HPI this 53 year old male had laser ablation of the right great saphenous vein from the mid thigh to near the saphenofemoral junction plus multiple stab phlebectomy of painful varicosities performed under local tumescent anesthesia. A total of 1556 J of energy was utilized. He tolerated procedures well. He has a stasis ulcer in the right ankle.   Review of Systems     Objective:   Physical Exam BP 195/123 mmHg  Pulse 70  Resp 16  Ht 6\' 1"  (1.854 m)  Wt 288 lb (130.636 kg)  BMI 38.01 kg/m2       Assessment:     Well-tolerated laser ablation right great saphenous vein with multiple stab phlebectomy of painful varicosities performed under local tumescent anesthesia     Plan:     Will place an Unna boot today and compressing dressing. Patient to return in one week for venous duplex exam to confirm closure right great saphenous vein and then return to wound center for further care

## 2014-09-11 ENCOUNTER — Telehealth: Payer: Self-pay | Admitting: *Deleted

## 2014-09-11 ENCOUNTER — Encounter: Payer: Self-pay | Admitting: Vascular Surgery

## 2014-09-11 NOTE — Telephone Encounter (Signed)
Pt. Doing well. Only problem is with the ace wraps. Went over how to unwrap and rewrap. Not having any pain except at ulcer area which is covered with an unna boot. Reminded him of his fu appts.

## 2014-09-14 ENCOUNTER — Encounter: Payer: Self-pay | Admitting: Vascular Surgery

## 2014-09-17 ENCOUNTER — Encounter: Payer: Self-pay | Admitting: Vascular Surgery

## 2014-09-17 ENCOUNTER — Ambulatory Visit (HOSPITAL_COMMUNITY)
Admission: RE | Admit: 2014-09-17 | Discharge: 2014-09-17 | Disposition: A | Discharge status: Home / Self Care | Payer: BC Managed Care – PPO | Source: Ambulatory Visit | Attending: Vascular Surgery | Admitting: Vascular Surgery

## 2014-09-17 ENCOUNTER — Ambulatory Visit (INDEPENDENT_AMBULATORY_CARE_PROVIDER_SITE_OTHER): Payer: BC Managed Care – PPO | Admitting: Vascular Surgery

## 2014-09-17 VITALS — BP 176/95 | HR 73 | Resp 16 | Ht 73.0 in | Wt 288.0 lb

## 2014-09-17 DIAGNOSIS — L97919 Non-pressure chronic ulcer of unspecified part of right lower leg with unspecified severity: Secondary | ICD-10-CM

## 2014-09-17 DIAGNOSIS — I83211 Varicose veins of right lower extremity with both ulcer of thigh and inflammation: Secondary | ICD-10-CM

## 2014-09-17 DIAGNOSIS — I83214 Varicose veins of right lower extremity with both ulcer of heel and midfoot and inflammation: Secondary | ICD-10-CM

## 2014-09-17 DIAGNOSIS — I83215 Varicose veins of right lower extremity with both ulcer other part of foot and inflammation: Secondary | ICD-10-CM

## 2014-09-17 DIAGNOSIS — I83219 Varicose veins of right lower extremity with both ulcer of unspecified site and inflammation: Secondary | ICD-10-CM

## 2014-09-17 DIAGNOSIS — I83019 Varicose veins of right lower extremity with ulcer of unspecified site: Secondary | ICD-10-CM | POA: Insufficient documentation

## 2014-09-17 DIAGNOSIS — I83218 Varicose veins of right lower extremity with both ulcer of other part of lower extremity and inflammation: Secondary | ICD-10-CM

## 2014-09-17 DIAGNOSIS — L97319 Non-pressure chronic ulcer of right ankle with unspecified severity: Principal | ICD-10-CM

## 2014-09-17 DIAGNOSIS — I83013 Varicose veins of right lower extremity with ulcer of ankle: Secondary | ICD-10-CM

## 2014-09-17 DIAGNOSIS — I83213 Varicose veins of right lower extremity with both ulcer of ankle and inflammation: Secondary | ICD-10-CM

## 2014-09-17 DIAGNOSIS — I83212 Varicose veins of right lower extremity with both ulcer of calf and inflammation: Secondary | ICD-10-CM

## 2014-09-17 NOTE — Progress Notes (Signed)
D.R. Horton, IncUnna Boot applied to R LE.

## 2014-09-17 NOTE — Progress Notes (Signed)
Subjective:     Patient ID: Kristopher Garcia, male   DOB: [birth year], 53 y.o.   MRN: [id number]  HPIthis patient returns 1 week post laser ablation right great saphenous vein plus multiple stab phlebectomy of painful varicosities. He is being treated for venous stasis ulcer right ankle. He currently has a Radio broadcast assistantUnna boot in place and is due to return to the wound center later this week. He has had mild discomfort in the mid to proximal thigh andpain over the stab phlebectomy sites. He denies any chest pain or dyspnea on exertion or hemoptysis.   Review of Systems     Objective:   Physical Exam There were no vitals taken for this visit.  Gen. Well-developed well-nourished male in no apparent stress alert and oriented 3 Right leg with mild discomfort in mid thigh area. Stab phlebectomy sites are healing well. Unna boot is in place over stasis ulcer  Today I ordered a venous duplex exam of the right leg which I reviewed and interpreted. There is no DVT. The right great saphenous vein is totally closed up to near the saphenofemoral junction.     Assessment:     Successful laser ablation right great saphenous vein for gross reflux with multiple stab phlebectomy's all performed under local tumescent anesthesia. Patient has active stasis ulcer right ankle     Plan:     Replace Unna boot today Patient to return to wound center later in week as scheduled Return to see us on when necessary basis

## 2014-09-19 DIAGNOSIS — I87331 Chronic venous hypertension (idiopathic) with ulcer and inflammation of right lower extremity: Secondary | ICD-10-CM | POA: Diagnosis present

## 2014-09-19 DIAGNOSIS — L97819 Non-pressure chronic ulcer of other part of right lower leg with unspecified severity: Secondary | ICD-10-CM | POA: Diagnosis not present

## 2014-09-26 DIAGNOSIS — I87331 Chronic venous hypertension (idiopathic) with ulcer and inflammation of right lower extremity: Secondary | ICD-10-CM | POA: Diagnosis not present

## 2014-10-01 ENCOUNTER — Encounter: Payer: Self-pay | Admitting: Family Medicine

## 2014-10-01 ENCOUNTER — Ambulatory Visit (INDEPENDENT_AMBULATORY_CARE_PROVIDER_SITE_OTHER): Payer: BC Managed Care – PPO | Admitting: Family Medicine

## 2014-10-01 VITALS — BP 174/96 | HR 74 | Temp 98.0°F | Resp 18 | Ht 72.0 in | Wt 285.6 lb

## 2014-10-01 DIAGNOSIS — I10 Essential (primary) hypertension: Secondary | ICD-10-CM

## 2014-10-01 DIAGNOSIS — Z125 Encounter for screening for malignant neoplasm of prostate: Secondary | ICD-10-CM

## 2014-10-01 DIAGNOSIS — Z0000 Encounter for general adult medical examination without abnormal findings: Secondary | ICD-10-CM

## 2014-10-01 DIAGNOSIS — H6123 Impacted cerumen, bilateral: Secondary | ICD-10-CM

## 2014-10-01 LAB — COMPLETE METABOLIC PANEL WITH GFR
ALT: 14 U/L (ref 0–53)
AST: 21 U/L (ref 0–37)
Albumin: 4.7 g/dL (ref 3.5–5.2)
Alkaline Phosphatase: 52 U/L (ref 39–117)
BUN: 19 mg/dL (ref 6–23)
CO2: 27 mEq/L (ref 19–32)
Calcium: 9.8 mg/dL (ref 8.4–10.5)
Chloride: 103 mEq/L (ref 96–112)
Creat: 1.11 mg/dL (ref 0.50–1.35)
GFR, Est African American: 87 mL/min
GFR, Est Non African American: 75 mL/min
Glucose, Bld: 89 mg/dL (ref 70–99)
Potassium: 4.3 mEq/L (ref 3.5–5.3)
Sodium: 141 mEq/L (ref 135–145)
Total Bilirubin: 0.7 mg/dL (ref 0.2–1.2)
Total Protein: 7.4 g/dL (ref 6.0–8.3)

## 2014-10-01 LAB — POCT URINALYSIS DIPSTICK
Bilirubin, UA: NEGATIVE
Blood, UA: NEGATIVE
Glucose, UA: NEGATIVE
Ketones, UA: NEGATIVE
Leukocytes, UA: NEGATIVE
Nitrite, UA: NEGATIVE
Protein, UA: NEGATIVE
Spec Grav, UA: 1.01
Urobilinogen, UA: 0.2
pH, UA: 5.5

## 2014-10-01 LAB — CBC WITH DIFFERENTIAL/PLATELET
Basophils Absolute: 0 10*3/uL (ref 0.0–0.1)
Basophils Relative: 1 % (ref 0–1)
Eosinophils Absolute: 0.1 10*3/uL (ref 0.0–0.7)
Eosinophils Relative: 3 % (ref 0–5)
HCT: 43.6 % (ref 39.0–52.0)
Hemoglobin: 14.6 g/dL (ref 13.0–17.0)
Lymphocytes Relative: 36 % (ref 12–46)
Lymphs Abs: 1.6 10*3/uL (ref 0.7–4.0)
MCH: 29.9 pg (ref 26.0–34.0)
MCHC: 33.5 g/dL (ref 30.0–36.0)
MCV: 89.3 fL (ref 78.0–100.0)
MPV: 9 fL — ABNORMAL LOW (ref 9.4–12.4)
Monocytes Absolute: 0.3 10*3/uL (ref 0.1–1.0)
Monocytes Relative: 7 % (ref 3–12)
Neutro Abs: 2.3 10*3/uL (ref 1.7–7.7)
Neutrophils Relative %: 53 % (ref 43–77)
Platelets: 251 10*3/uL (ref 150–400)
RBC: 4.88 MIL/uL (ref 4.22–5.81)
RDW: 14.6 % (ref 11.5–15.5)
WBC: 4.4 10*3/uL (ref 4.0–10.5)

## 2014-10-01 LAB — LIPID PANEL
Cholesterol: 163 mg/dL (ref 0–200)
HDL: 61 mg/dL (ref >39)
LDL Cholesterol: 90 mg/dL (ref 0–99)
Total CHOL/HDL Ratio: 2.7 Ratio
Triglycerides: 62 mg/dL (ref <150)
VLDL: 12 mg/dL (ref 0–40)

## 2014-10-01 LAB — IFOBT (OCCULT BLOOD): IFOBT: POSITIVE

## 2014-10-01 MED ORDER — LISINOPRIL-HYDROCHLOROTHIAZIDE 20-12.5 MG PO TABS: 1.0000 | ORAL_TABLET | Freq: Every day | ORAL | Status: DC

## 2014-10-01 NOTE — Progress Notes (Signed)
Following verbal consent, colace drops placed in bilateral ears, after 5 minutes bilateral ears flushed gently with peroxide and warm water 1:1 ratio. Patient tolerated well with no complaints of discomfort

## 2014-10-01 NOTE — Progress Notes (Signed)
This chart was scribed for Elvina SidleKurt Lauenstein, MD by Julian HyMorgan Graham, ED Scribe. The patient was seen in Room 28. The patient's care was started at 10:18 AM.   Patient ID: Kristopher Garcia MRN: [id number], DOB: Apr 03, 1961 53 y.o. Date of Encounter: 10/01/2014, 10:17 AM  Primary Physician: Elvina SidleLAUENSTEIN,KURT, MD  Chief Complaint: Physical (CPE)  HPI: 53 y.o. y/o male with history noted below here for CPE.  Pt goes to Wound Care for a wound on his lower right leg from an accident about 9 months ago. Pt wears a Unaboot on the right lower extremity. He notes he has done physical therapy with increased ROM. He had an Epigraft from cultured tissue about two weeks ago. He sees Dr. Ardath SaxPeter Parker for this issue. He notes he is also having issues with BP and inconsistent readings. Pt notes he must wear hearing protection at work, so his ears occasionally get clogged but he denies hearing issues at this time.  Colonoscopy: Pt denies ever having a colonoscopy. Influenza Vaccine: Pt declines flu vaccine at this time. DDS: Needs to schedule an appointment.  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  Review of Systems: Consitutional: No fever, chills, fatigue, night sweats, lymphadenopathy, or weight changes. Eyes: No visual changes, eye redness, or discharge. ENT/Mouth: Ears: No otalgia, tinnitus, hearing loss, discharge. Nose: No congestion, rhinorrhea, sinus pain, or epistaxis. Throat: No sore throat, post nasal drip, or teeth pain. Cardiovascular: No CP, palpitations, diaphoresis, DOE, edema, orthopnea, PND. Respiratory: No cough, hemoptysis, SOB, or wheezing. Gastrointestinal: No anorexia, dysphagia, reflux, pain, nausea, vomiting, hematemesis, diarrhea, constipation, BRBPR, or melena. Genitourinary: No dysuria, frequency, urgency, hematuria, incontinence, nocturia, decreased urinary stream, discharge, impotence, or testicular pain/masses. Musculoskeletal:  No decreased ROM, myalgias, stiffness, joint swelling, or weakness. Skin: No rash, erythema, lesion changes, pain, warmth, jaundice, or pruritis.  Neurological: No headache, dizziness, syncope, seizures, tremors, memory loss, coordination problems, or paresthesias. Psychological: No anxiety, depression, hallucinations, SI/HI. Endocrine: No fatigue, polydipsia, polyphagia, polyuria, or known diabetes. All other systems were reviewed and are otherwise negative.  Pt works at [employer].  Past Medical History  Diagnosis Date   Hypertension    Obesity    Obstructive sleep apnea    Allergy    OA (osteoarthritis)    Dyslipidemia      Past Surgical History  Procedure Laterality Date   Neck surgery     Spine surgery     Cardiac catheterization  06/21/12    Home Meds:  Prior to Admission medications   Medication Sig Start Date End Date Taking? Authorizing Provider  aspirin 500 MG EC tablet Take 500 mg by mouth every 6 (six) hours as needed for pain.   Yes Historical Provider, MD  furosemide (LASIX) 20 MG tablet TAKE 1 TABLET (20 MG TOTAL) BY MOUTH DAILY. 08/22/14  Yes Elvina SidleKurt Lauenstein, MD  HYDROcodone-acetaminophen (NORCO/VICODIN) 5-325 MG per tablet Take 1 tablet by mouth every 6 (six) hours as needed. for pain 07/27/14  Yes Historical Provider, MD  lisinopril (PRINIVIL,ZESTRIL) 20 MG tablet Take 1 tablet (20 mg total) by mouth daily. 05/20/14  Yes Elvina SidleKurt Lauenstein, MD    Allergies: No Known Allergies  History   Social History   Marital Status: Married    Spouse Name: N/A    Number of Children: N/A   Years of Education: N/A   Occupational History   [employer]    Social History Main Topics   Smoking status: Never Smoker    Smokeless tobacco:  Never Used   Alcohol Use: No   Drug Use: No   Sexual Activity: Yes   Other Topics Concern   Not on file   Social History Narrative    Family History  Problem Relation Age of Onset   Cancer Mother 6962    lung  cancer   Diabetes Mother    Stroke Mother    Heart disease Father    Mental illness Sister     Physical Exam: Blood pressure 174/96, pulse 74, temperature 98 F (36.7 C), temperature source Oral, resp. rate 18, height 6' (1.829 m), weight 285 lb 9.6 oz (129.547 kg), SpO2 99 %.  BP Readings from Last 3 Encounters:  10/01/14 174/96  09/17/14 176/95  09/10/14 195/123   General: Well developed, well nourished, in no acute distress. HEENT: Normocephalic, atraumatic. Conjunctiva pink, sclera non-icteric. Pupils 2 mm constricting to 1 mm, round, regular, and equally reactive to light and accomodation. EOMI. Internal auditory canal clear. TMs with good cone of light and without pathology. Nasal mucosa pink after cerumen lavaged out. Nares are without discharge. No sinus tenderness. Oral mucosa pink. Dentition good. Pharynx without exudate.    Neck: Supple. Trachea midline. No thyromegaly. Full ROM. No lymphadenopathy. Lungs: Clear to auscultation bilaterally without wheezes, rales, or rhonchi. Breathing is of normal effort and unlabored. Cardiovascular: RRR with S1 S2. No murmurs, rubs, or gallops appreciated. Distal pulses 2+ symmetrically. No carotid or abdominal bruits Abdomen: Soft, non-tender, non-distended with normoactive bowel sounds. No hepatosplenomegaly or masses. No rebound/guarding. No CVA tenderness. Without hernias.  Rectal: No external hemorrhoids or fissures. Rectal vault without masses  Genitourinary:  circumcised male. No penile lesions. Testes descended bilaterally, and smooth without tenderness or masses.  Musculoskeletal: Full range of motion and 5/5 strength throughout. Without swelling, atrophy, tenderness, crepitus, or warmth. Extremities without clubbing, cyanosis, or edema. Calves supple. Skin: Warm and moist without erythema, ecchymosis, wounds, or rash. Neuro: A+Ox3. CN II-XII grossly intact. Moves all extremities spontaneously. Full sensation throughout. Normal  gait. DTR 2+ throughout upper and lower extremities. Finger to nose intact. Psych:  Responds to questions appropriately with a normal affect.   Lab Results  Component Value Date   CHOL 164 05/04/2014   Lab Results  Component Value Date   HDL 50 05/04/2014   Lab Results  Component Value Date   LDLCALC 104* 05/04/2014   Lab Results  Component Value Date   TRIG 50 05/04/2014   Lab Results  Component Value Date   CHOLHDL 3.3 05/04/2014   No results found for: LDLDIRECT  Manual BP reading left arm while sitting: 180/110 left arm  Assessment/Plan:  53 y.o. y/o active male here for CPE Essential hypertension - Plan: CBC with Differential, COMPLETE METABOLIC PANEL WITH GFR, Lipid panel, POCT urinalysis dipstick, lisinopril-hydrochlorothiazide (ZESTORETIC) 20-12.5 MG per tablet  Screening for prostate cancer - Plan: PSA, IFOBT POC (occult bld, rslt in office)  Morbid obesity  Follow up in a month to recheck BP -  Signed, Elvina SidleKurt Lauenstein, MD 10/01/2014 10:17 AM

## 2014-10-01 NOTE — Progress Notes (Signed)
   Subjective:    Patient ID: Kristopher Garcia, male    DOB: [birth year], 53 y.o.   MRN: [id number]  HPI    Review of Systems  Constitutional: Negative.   HENT: Negative.   Eyes: Negative.   Respiratory: Negative.   Cardiovascular: Negative.   Gastrointestinal: Negative.   Endocrine: Negative.   Genitourinary: Negative.   Musculoskeletal: Negative.   Skin: Positive for wound.  Allergic/Immunologic: Negative.   Neurological: Negative.   Hematological: Negative.   Psychiatric/Behavioral: Negative.        Objective:   Physical Exam        Assessment & Plan:

## 2014-10-02 LAB — PSA: PSA: 0.86 ng/mL (ref <=4.00)

## 2014-10-03 ENCOUNTER — Encounter (HOSPITAL_BASED_OUTPATIENT_CLINIC_OR_DEPARTMENT_OTHER): Payer: BC Managed Care – PPO | Attending: General Surgery

## 2014-10-03 DIAGNOSIS — I87331 Chronic venous hypertension (idiopathic) with ulcer and inflammation of right lower extremity: Secondary | ICD-10-CM | POA: Insufficient documentation

## 2014-10-03 DIAGNOSIS — L97811 Non-pressure chronic ulcer of other part of right lower leg limited to breakdown of skin: Secondary | ICD-10-CM | POA: Diagnosis not present

## 2014-10-10 DIAGNOSIS — I87331 Chronic venous hypertension (idiopathic) with ulcer and inflammation of right lower extremity: Secondary | ICD-10-CM | POA: Diagnosis not present

## 2014-10-10 DIAGNOSIS — L97811 Non-pressure chronic ulcer of other part of right lower leg limited to breakdown of skin: Secondary | ICD-10-CM | POA: Diagnosis not present

## 2014-10-11 ENCOUNTER — Encounter (HOSPITAL_COMMUNITY): Payer: Self-pay | Admitting: Cardiology

## 2014-10-17 DIAGNOSIS — I87331 Chronic venous hypertension (idiopathic) with ulcer and inflammation of right lower extremity: Secondary | ICD-10-CM | POA: Diagnosis not present

## 2014-10-24 DIAGNOSIS — I87331 Chronic venous hypertension (idiopathic) with ulcer and inflammation of right lower extremity: Secondary | ICD-10-CM | POA: Diagnosis not present

## 2014-10-24 DIAGNOSIS — L97811 Non-pressure chronic ulcer of other part of right lower leg limited to breakdown of skin: Secondary | ICD-10-CM | POA: Diagnosis not present

## 2014-10-30 ENCOUNTER — Other Ambulatory Visit: Payer: Self-pay

## 2014-10-30 MED ORDER — FUROSEMIDE 20 MG PO TABS: ORAL_TABLET | ORAL | Status: DC

## 2014-10-31 DIAGNOSIS — I87331 Chronic venous hypertension (idiopathic) with ulcer and inflammation of right lower extremity: Secondary | ICD-10-CM | POA: Diagnosis not present

## 2014-11-07 ENCOUNTER — Encounter (HOSPITAL_BASED_OUTPATIENT_CLINIC_OR_DEPARTMENT_OTHER): Payer: BLUE CROSS/BLUE SHIELD | Attending: General Surgery

## 2014-11-07 DIAGNOSIS — L97919 Non-pressure chronic ulcer of unspecified part of right lower leg with unspecified severity: Secondary | ICD-10-CM | POA: Diagnosis not present

## 2014-11-07 DIAGNOSIS — I87331 Chronic venous hypertension (idiopathic) with ulcer and inflammation of right lower extremity: Secondary | ICD-10-CM | POA: Insufficient documentation

## 2014-11-08 ENCOUNTER — Ambulatory Visit: Payer: BC Managed Care – PPO | Admitting: Family Medicine

## 2014-11-14 DIAGNOSIS — I87331 Chronic venous hypertension (idiopathic) with ulcer and inflammation of right lower extremity: Secondary | ICD-10-CM | POA: Diagnosis not present

## 2014-11-21 DIAGNOSIS — I87331 Chronic venous hypertension (idiopathic) with ulcer and inflammation of right lower extremity: Secondary | ICD-10-CM | POA: Diagnosis not present

## 2014-11-28 DIAGNOSIS — I87331 Chronic venous hypertension (idiopathic) with ulcer and inflammation of right lower extremity: Secondary | ICD-10-CM | POA: Diagnosis not present

## 2014-12-05 ENCOUNTER — Encounter (HOSPITAL_BASED_OUTPATIENT_CLINIC_OR_DEPARTMENT_OTHER): Payer: BLUE CROSS/BLUE SHIELD | Attending: Surgery

## 2014-12-05 DIAGNOSIS — I878 Other specified disorders of veins: Secondary | ICD-10-CM | POA: Diagnosis present

## 2014-12-05 DIAGNOSIS — L97911 Non-pressure chronic ulcer of unspecified part of right lower leg limited to breakdown of skin: Secondary | ICD-10-CM | POA: Diagnosis not present

## 2014-12-06 NOTE — H&P (Signed)
NAME:  Kristopher Garcia, King              ACCOUNT NO.:  [id number]  MEDICAL RECORD NO.:  [id number]  LOCATION:  FOOT                         FACILITY:  [facility]  PHYSICIAN:  Kristopher KannerErrol Zabdiel Dripps, MD       DATE OF BIRTH:  12/11/1960  DATE OF ADMISSION:  12/05/2014 DATE OF DISCHARGE:                             HISTORY & PHYSICAL   This pleasant 54 year old gentleman was seen back in the Wound Clinic after I had applied a 5th application of the Apligraf, which was done in the usual manner on November 21, 2014.  He has done well, had come for a nurse visit last week, and has no fresh issues.  His Unna boot was reapplied.  On review today subjectively, he is doing fine.  On objective examination, he is awake and alert, looks well.  Vital signs are stable.  He is afebrile and has no fresh issues.  His ulcer on the venous part of his leg was 3.5 x 6.1 x 0.2 cm in size. There was some maceration surrounding the ulceration and I have sharply debrided a piece of his surrounding skin and this is a selective debridement done with curette and forceps.  The size of the ulcer post debridement was 3.5 x 6.1 x 0.1 cm.  No bleeding.  The patient tolerated the procedure well.  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 The graft site looks healthy.  PLAN OF CARE:  We will apply collagen and Drawtex and then Unna boot today.  Note is made of the fact that he was last seen by his vascular surgeon on September 10, 2014, and somewhere around then he had a right greater saphenous vein laser ablation from the midthigh to the saphenofemoral junction with multiple stab phlebectomies of the varicosities were done under local anesthesia.  He has had this treatment appropriately and now I think supportive care with Unna boots will help heal his wound.  The patient has had all questions answered and we will see him back next  week.          ______________________________ Kristopher KannerErrol Hannahmarie Asberry, MD     EB/MEDQ  D:  12/05/2014  T:  12/06/2014  Job:  [id number]  cc:   [facility]

## 2014-12-12 DIAGNOSIS — I878 Other specified disorders of veins: Secondary | ICD-10-CM | POA: Diagnosis not present

## 2014-12-13 NOTE — H&P (Signed)
NAME:  Kristopher Garcia, Kristopher Garcia              ACCOUNT NO.:  [id number]  MEDICAL RECORD NO.:  [id number]  LOCATION:  FOOT                         FACILITY:  [facility]  PHYSICIAN:  Evlyn KannerErrol Dimitrius Steedman, MD       DATE OF BIRTH:  [birth year]  DATE OF ADMISSION:  12/05/2014 DATE OF DISCHARGE:                             HISTORY & PHYSICAL   HISTORY OF PRESENT ILLNESS:  This pleasant 54 year old gentleman who has recently had the fifth application of his Apligraf which was done on November 21, 2014, now comes for a review of his wound.  His Unna boot applied last week and has been doing fine with no new problems.  PHYSICAL EXAMINATION:  GENERAL:  On clinical examination, he is awake and alert. VITAL SIGNS:  Stable.  He is afebrile, has pulse of 83, blood pressure of 166/88.  Height is 6 feet 1 inch, weight is 275 pounds. EXTREMITIES:  On examination of the area of his ulceration on his right lower extremity, he has an ulceration which is 2.2 x 5.3 x 0.1.  He has minimal slough at the edges and other than that, it looks fairly healthy.  With the appropriate time-out and lidocaine application, I have gently debrided a selective part of his lateral wound where there was a lot of slough.  This was washed out with saline.  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  The graft site looks clean and healthy.  Plan of care, we will continue to apply collagen and Drawtex and then Unna boot.  He is agreeable with the treatment plan and will come back to see us next week.          ______________________________ Evlyn KannerErrol Carlei Huang, MD     EB/MEDQ  D:  12/12/2014  T:  12/13/2014  Job:  [id number]

## 2014-12-19 DIAGNOSIS — I878 Other specified disorders of veins: Secondary | ICD-10-CM | POA: Diagnosis not present

## 2014-12-20 NOTE — H&P (Signed)
NAME:  Kristopher Garcia, Kristopher              ACCOUNT NO.:  [id number]  MEDICAL RECORD NO.:  [id number]  LOCATION:  FOOT                         FACILITY:  [facility]  PHYSICIAN:  Evlyn KannerErrol Kadin Canipe, MD       DATE OF BIRTH:  [birth year]  DATE OF ADMISSION:  12/05/2014 DATE OF DISCHARGE:                             HISTORY & PHYSICAL   OFFICE VISIT:  This pleasant 54 year old gentleman, who has a venous ulcer on his right medial leg has recently completed applications of Apligraf on November 29, 2014.  He has an Radio broadcast assistantunna boot and local care and has come back for the review.  He seems to be doing fine and has no problems.  On clinical examination, he is awake and alert, laying comfortably in bed.  Vital signs are stable.  He is 6 feet 1 inches in height, 282 pounds.  Examination of the patient's vital signs reveal he has got a temperature of 98.3, pulse of 70, respiration of 18, and BP of 155/87. On examination of the ulceration, he has got length of 2.5, width of 4.5, and depth of 0.1.  Surrounding he has a lot of dry skin and debris which needs to be sharply debrided.  PROCEDURE:  With an appropriate time-out and appropriate amount of lidocaine, I have sharply debrided lot of skin eschar and necrotic debris around the ulcer.  The ulcer itself has not been debrided and it has been washed out with saline.  No bleeding and this is a selective debridement.  PLAN AND RECOMMENDATION: We will continue to apply collagen and Gore-Tex and then apply Unna boot.  He is agreeable to be seen next week and he will come back for the review.          ______________________________ Evlyn KannerErrol Jaxston Chohan, MD     EB/MEDQ  D:  12/19/2014  T:  12/20/2014  Job:  [id number]  cc:   Wound Care Office

## 2014-12-22 ENCOUNTER — Other Ambulatory Visit: Payer: Self-pay | Admitting: Family Medicine

## 2014-12-26 DIAGNOSIS — I878 Other specified disorders of veins: Secondary | ICD-10-CM | POA: Diagnosis not present

## 2015-01-02 ENCOUNTER — Encounter (HOSPITAL_BASED_OUTPATIENT_CLINIC_OR_DEPARTMENT_OTHER): Payer: BLUE CROSS/BLUE SHIELD | Attending: Surgery

## 2015-01-02 DIAGNOSIS — L97211 Non-pressure chronic ulcer of right calf limited to breakdown of skin: Secondary | ICD-10-CM | POA: Insufficient documentation

## 2015-01-02 DIAGNOSIS — I872 Venous insufficiency (chronic) (peripheral): Secondary | ICD-10-CM | POA: Diagnosis not present

## 2015-01-09 DIAGNOSIS — I872 Venous insufficiency (chronic) (peripheral): Secondary | ICD-10-CM | POA: Diagnosis not present

## 2015-01-16 DIAGNOSIS — I872 Venous insufficiency (chronic) (peripheral): Secondary | ICD-10-CM | POA: Diagnosis not present

## 2015-01-23 DIAGNOSIS — I872 Venous insufficiency (chronic) (peripheral): Secondary | ICD-10-CM | POA: Diagnosis not present

## 2015-01-30 DIAGNOSIS — I872 Venous insufficiency (chronic) (peripheral): Secondary | ICD-10-CM | POA: Diagnosis not present

## 2015-02-06 ENCOUNTER — Encounter (HOSPITAL_BASED_OUTPATIENT_CLINIC_OR_DEPARTMENT_OTHER): Payer: BLUE CROSS/BLUE SHIELD | Attending: Surgery

## 2015-02-06 DIAGNOSIS — I87312 Chronic venous hypertension (idiopathic) with ulcer of left lower extremity: Secondary | ICD-10-CM | POA: Insufficient documentation

## 2015-02-06 DIAGNOSIS — J449 Chronic obstructive pulmonary disease, unspecified: Secondary | ICD-10-CM | POA: Diagnosis not present

## 2015-02-06 DIAGNOSIS — J45909 Unspecified asthma, uncomplicated: Secondary | ICD-10-CM | POA: Diagnosis not present

## 2015-02-06 DIAGNOSIS — L97821 Non-pressure chronic ulcer of other part of left lower leg limited to breakdown of skin: Secondary | ICD-10-CM | POA: Diagnosis not present

## 2015-02-06 DIAGNOSIS — I739 Peripheral vascular disease, unspecified: Secondary | ICD-10-CM | POA: Insufficient documentation

## 2015-02-13 DIAGNOSIS — I87312 Chronic venous hypertension (idiopathic) with ulcer of left lower extremity: Secondary | ICD-10-CM | POA: Diagnosis not present

## 2015-02-20 DIAGNOSIS — I87312 Chronic venous hypertension (idiopathic) with ulcer of left lower extremity: Secondary | ICD-10-CM | POA: Diagnosis not present

## 2015-02-27 DIAGNOSIS — I87312 Chronic venous hypertension (idiopathic) with ulcer of left lower extremity: Secondary | ICD-10-CM | POA: Diagnosis not present

## 2015-03-21 ENCOUNTER — Other Ambulatory Visit: Payer: Self-pay | Admitting: Family Medicine

## 2015-05-23 ENCOUNTER — Other Ambulatory Visit: Payer: Self-pay | Admitting: Family Medicine

## 2015-06-19 ENCOUNTER — Other Ambulatory Visit: Payer: Self-pay | Admitting: Family Medicine

## 2015-08-23 ENCOUNTER — Other Ambulatory Visit: Payer: Self-pay | Admitting: Family Medicine

## 2017-08-12 ENCOUNTER — Emergency Department (HOSPITAL_COMMUNITY)
Admission: EM | Admit: 2017-08-12 | Discharge: 2017-08-12 | Disposition: A | Discharge status: Home / Self Care | Payer: BLUE CROSS/BLUE SHIELD | Attending: Emergency Medicine | Admitting: Emergency Medicine

## 2017-08-12 ENCOUNTER — Encounter (HOSPITAL_COMMUNITY): Payer: Self-pay

## 2017-08-12 DIAGNOSIS — I10 Essential (primary) hypertension: Secondary | ICD-10-CM | POA: Diagnosis present

## 2017-08-12 LAB — CBC WITH DIFFERENTIAL/PLATELET
BASOS ABS: 0 10*3/uL (ref 0.0–0.1)
Basophils Relative: 1 %
Eosinophils Absolute: 0.1 10*3/uL (ref 0.0–0.7)
Eosinophils Relative: 2 %
HCT: 46.4 % (ref 39.0–52.0)
HEMOGLOBIN: 14.8 g/dL (ref 13.0–17.0)
LYMPHS ABS: 1.7 10*3/uL (ref 0.7–4.0)
LYMPHS PCT: 36 %
MCH: 30 pg (ref 26.0–34.0)
MCHC: 31.9 g/dL (ref 30.0–36.0)
MCV: 94.1 fL (ref 78.0–100.0)
Monocytes Absolute: 0.4 10*3/uL (ref 0.1–1.0)
Monocytes Relative: 8 %
NEUTROS PCT: 53 %
Neutro Abs: 2.5 10*3/uL (ref 1.7–7.7)
Platelets: 211 10*3/uL (ref 150–400)
RBC: 4.93 MIL/uL (ref 4.22–5.81)
RDW: 14.8 % (ref 11.5–15.5)
WBC: 4.7 10*3/uL (ref 4.0–10.5)

## 2017-08-12 LAB — URINALYSIS, ROUTINE W REFLEX MICROSCOPIC
Bilirubin Urine: NEGATIVE
GLUCOSE, UA: NEGATIVE mg/dL
Hgb urine dipstick: NEGATIVE
KETONES UR: NEGATIVE mg/dL
LEUKOCYTES UA: NEGATIVE
Nitrite: NEGATIVE
PH: 7 (ref 5.0–8.0)
Protein, ur: 30 mg/dL — AB
SQUAMOUS EPITHELIAL / LPF: NONE SEEN
Specific Gravity, Urine: 1.015 (ref 1.005–1.030)

## 2017-08-12 LAB — COMPREHENSIVE METABOLIC PANEL
ALT: 25 U/L (ref 17–63)
ANION GAP: 6 (ref 5–15)
AST: 30 U/L (ref 15–41)
Albumin: 4.3 g/dL (ref 3.5–5.0)
Alkaline Phosphatase: 59 U/L (ref 38–126)
BUN: 20 mg/dL (ref 6–20)
CHLORIDE: 105 mmol/L (ref 101–111)
CO2: 29 mmol/L (ref 22–32)
Calcium: 9.3 mg/dL (ref 8.9–10.3)
Creatinine, Ser: 1.4 mg/dL — ABNORMAL HIGH (ref 0.61–1.24)
GFR, EST NON AFRICAN AMERICAN: 54 mL/min — AB (ref >60)
Glucose, Bld: 86 mg/dL (ref 65–99)
POTASSIUM: 3.8 mmol/L (ref 3.5–5.1)
Sodium: 140 mmol/L (ref 135–145)
Total Bilirubin: 0.9 mg/dL (ref 0.3–1.2)
Total Protein: 7.1 g/dL (ref 6.5–8.1)

## 2017-08-12 LAB — TSH: TSH: 0.807 u[IU]/mL (ref 0.350–4.500)

## 2017-08-12 MED ORDER — HYDROCHLOROTHIAZIDE 25 MG PO TABS: 25.0000 mg | ORAL_TABLET | Freq: Once | ORAL | Status: DC

## 2017-08-12 MED ORDER — LISINOPRIL 20 MG PO TABS
20.0000 mg | ORAL_TABLET | Freq: Once | ORAL | Status: AC
Start: 2017-08-12 — End: 2017-08-12
  Administered 2017-08-12: 20 mg via ORAL
  Filled 2017-08-12: qty 1

## 2017-08-12 MED ORDER — LABETALOL HCL 5 MG/ML IV SOLN
10.0000 mg | Freq: Once | INTRAVENOUS | Status: DC
  Filled 2017-08-12: qty 4

## 2017-08-12 MED ORDER — HYDROCHLOROTHIAZIDE 12.5 MG PO CAPS
12.5000 mg | ORAL_CAPSULE | Freq: Every day | ORAL | Status: DC
  Administered 2017-08-12: 12.5 mg via ORAL
  Filled 2017-08-12: qty 1

## 2017-08-12 MED ORDER — LISINOPRIL-HYDROCHLOROTHIAZIDE 20-12.5 MG PO TABS: 1.0000 | ORAL_TABLET | Freq: Every day | ORAL | 0 refills | Status: DC

## 2017-08-12 NOTE — ED Provider Notes (Signed)
[facility]-EMERGENCY DEPT Provider Note   CSN: [id number] Arrival date & time: 08/12/17  1531     History   Chief Complaint Chief Complaint  Patient presents with  . Hypertension    HPI Kristopher Garcia is a 56 y.o. male.  56 year old male history of untreated hypertension, OSA, and obesity who presents with asymptomatic hypertension.  Patient was about to undergo a root canal when he was noted to be hypertensive with SBP>200s.  Patient was sent to ED for further evaluation.  He states he has been under a lot of stress over the past several months.  He states he works at least 11 hours for 6 days each week.  He feels his marriage is failing. He states he does not get restful sleep due to multiple distractions at night including pets which he does not want.  He states he ran out of prescription for CPAP use for OSA.  He has been lost to follow-up by PCP for 4 years and has not taken an antihypertensive since then.  He denies any headache, chest pain, shortness breath, or abdominal pain.  The history is provided by the patient and medical records. No language interpreter was used.    Past Medical History:  Diagnosis Date  . Allergy   . Dyslipidemia   . Hypertension   . OA (osteoarthritis)   . Obesity   . Obstructive sleep apnea     Patient Active Problem List   Diagnosis Date Noted  . Varicose veins of lower extremities with ulcer (HCC) 09/03/2014  . OSA (obstructive sleep apnea) 08/02/2014  . Essential hypertension 05/04/2014  . Morbid obesity (HCC) 05/04/2014    Past Surgical History:  Procedure Laterality Date  . CARDIAC CATHETERIZATION  06/21/12  . LEFT HEART CATHETERIZATION WITH CORONARY ANGIOGRAM N/A 06/21/2012   Procedure: LEFT HEART CATHETERIZATION WITH CORONARY ANGIOGRAM;  Surgeon: Donato Schultz, MD;  Location: [facility] CATH LAB;  Service: Cardiovascular;  Laterality: N/A;  . NECK SURGERY    . SPINE SURGERY         Home Medications    Prior to Admission medications     Medication Sig Start Date End Date Taking? Authorizing Provider  furosemide (LASIX) 20 MG tablet TAKE 1 TABLET (20 MG TOTAL) BY MOUTH DAILY. Patient not taking: Reported on 08/12/2017 12/24/14   Morrell Riddle, PA-C  furosemide (LASIX) 20 MG tablet TAKE 1 TABLET (20 MG TOTAL) BY MOUTH DAILY. Patient not taking: Reported on 08/12/2017 06/20/15   Elvina Sidle, MD  lisinopril (PRINIVIL,ZESTRIL) 20 MG tablet TAKE 1 TABLET BY MOUTH EVERY DAY  "OFFICE VISIT NEEDED FOR REFILLS" Patient not taking: Reported on 08/12/2017 08/23/15   Elvina Sidle, MD  lisinopril-hydrochlorothiazide (ZESTORETIC) 20-12.5 MG tablet Take 1 tablet by mouth daily. 08/12/17   Hebert Soho, MD    Family History Family History  Problem Relation Age of Onset  . Cancer Mother 64       lung cancer  . Diabetes Mother   . Stroke Mother   . Heart disease Father   . Mental illness Sister     Social History Social History  Substance Use Topics  . Smoking status: Never Smoker  . Smokeless tobacco: Never Used  . Alcohol use No     Allergies   Bee venom   Review of Systems Review of Systems  Constitutional: Negative for chills and fever.  HENT: Negative for ear pain and sore throat.   Eyes: Negative for pain and visual disturbance.  Respiratory: Negative for  cough and shortness of breath.   Cardiovascular: Negative for chest pain and palpitations.  Gastrointestinal: Negative for abdominal pain and vomiting.  Genitourinary: Negative for dysuria and hematuria.  Musculoskeletal: Negative for arthralgias and back pain.  Skin: Negative for color change and rash.  Neurological: Negative for seizures and syncope.  All other systems reviewed and are negative.    Physical Exam Updated Vital Signs BP (!) 212/121   Pulse 78   Temp 98.2 F (36.8 C) (Oral)   Resp 17   SpO2 100%   Physical Exam  Constitutional: He appears well-developed and well-nourished.  HENT:  Head: Normocephalic and atraumatic.  Eyes:  Conjunctivae are normal.  Neck: Neck supple.  Cardiovascular: Normal rate and regular rhythm.   No murmur heard. Pulmonary/Chest: Effort normal and breath sounds normal. No respiratory distress.  Abdominal: Soft. There is no tenderness.  Musculoskeletal: He exhibits no edema.  Neurological: He is alert. No cranial nerve deficit. Coordination normal.  5/5 motor strength and intact sensation in all extremities. Intact bilateral finger-to-nose coordination  Skin: Skin is warm and dry.  Nursing note and vitals reviewed.    ED Treatments / Results  Labs (all labs ordered are listed, but only abnormal results are displayed) Labs Reviewed  COMPREHENSIVE METABOLIC PANEL - Abnormal; Notable for the following:       Result Value   Creatinine, Ser 1.40 (*)    GFR calc non Af Amer 54 (*)    All other components within normal limits  URINALYSIS, ROUTINE W REFLEX MICROSCOPIC - Abnormal; Notable for the following:    Protein, ur 30 (*)    Bacteria, UA RARE (*)    All other components within normal limits  CBC WITH DIFFERENTIAL/PLATELET  TSH    EKG  EKG Interpretation None       Radiology No results found.  Procedures Procedures (including critical care time)  Medications Ordered in ED Medications  lisinopril (PRINIVIL,ZESTRIL) tablet 20 mg (20 mg Oral Given 08/12/17 1928)     Initial Impression / Assessment and Plan / ED Course  I have reviewed the triage vital signs and the nursing notes.  Pertinent labs & imaging results that were available during my care of the patient were reviewed by me and considered in my medical decision making (see chart for details).     56 year old with untreated hypertension who presents with asymptomatic hypertension.  Lost to follow-up and has not taken antihypertensive for 4 years.  Multiple stressors.  No signs of endorgan damage.  No focal neuro deficits.  Lungs clear to auscultation bilaterally.  Hypertensive to 210s/120s on arrival.  Has  previously taken lisinopril and HCTZ.  P.o. medicines given.  BP improved to 190/120s.  The patient remains HDS.  Prescriptions refilled.  Emphasized importance of close PCP follow-up and blood pressure rechecks.  Return precautions provided for worsening symptoms. Pt will f/u with PCP at first availability. Pt verbalized agreement with plan.  Pt care d/w Dr. Silverio Lay  Final Clinical Impressions(s) / ED Diagnoses   Final diagnoses:  Asymptomatic hypertension    New Prescriptions Discharge Medication List as of 08/12/2017  9:31 PM       Brynlea Spindler, Homero Fellers, MD 08/13/17 0408    Charlynne Pander, MD 08/16/17 1451

## 2017-08-12 NOTE — ED Notes (Signed)
Pt ambulated independently to the bathroom NAD noted.

## 2017-08-12 NOTE — ED Triage Notes (Signed)
Pt reports he went to have dental procedure done today and was sent here for high blood pressure. Pt alert and oriented, asymptomatic. Denies any headache or blurred vision. Is noted to be hypertensive in triage.

## 2017-08-12 NOTE — ED Notes (Signed)
Social work at bedside "[employer]"

## 2017-08-12 NOTE — ED Notes (Signed)
Patient Alert and oriented X4. Stable and ambulatory. Patient verbalized understanding of the discharge instructions.  Patient belongings were taken by the patient.  

## 2017-08-12 NOTE — Discharge Instructions (Addendum)
Please take blood pressure medicine as prescribed. Please followup with primary doctor at first availability. Minimize stressors as possible.

## 2017-09-09 ENCOUNTER — Ambulatory Visit (INDEPENDENT_AMBULATORY_CARE_PROVIDER_SITE_OTHER): Payer: BLUE CROSS/BLUE SHIELD | Admitting: Physician Assistant

## 2017-09-09 ENCOUNTER — Encounter: Payer: Self-pay | Admitting: Physician Assistant

## 2017-09-09 DIAGNOSIS — I10 Essential (primary) hypertension: Secondary | ICD-10-CM

## 2017-09-09 MED ORDER — LISINOPRIL-HYDROCHLOROTHIAZIDE 20-25 MG PO TABS: 1.0000 | ORAL_TABLET | Freq: Every day | ORAL | 3 refills | Status: DC

## 2017-09-09 NOTE — Progress Notes (Signed)
Patient ID: Kristopher Garcia, male     DOB: 06/05/1961, 56 y.o.    MRN: [id number]  PCP: Patient, No Pcp Per  Chief Complaint  Patient presents with  . Hypertension    Pt was recently in the ED for BP. BP at the ED was 220/120. Pt was given Lisinopril-HCTZ while in the ED and needs refill. Pt states he isn't having any symptoms of high BP.  Marland Kitchen Medication Refill    Lisinopril-HCTZ 20-12.5 MG    Subjective:   This patient is new to me and presents for evaluation of HTN. Previously followed by Dr. Joseph Garcia, who has retired.  He presented to the ED on 08/12/2017 with elevated blood pressure. He had been at the dentist for a root canal, but SBP>200. He related stress x several months, working 11 hours/day, 6 days/week, marital stress, lack of restorative sleep. He "ran out of prescription for CPAP" and lisinoprilHCTZ. He was last here 09/2014 for a wellness exam with his previous PCP. He denied CP, SOB, HA, dizziness, abdominal pain.  BP in the ED was 212/121, pulse 78. Exam was normal. CMET revealed creatinine 1.40, eGFR 54. UA revealed 30 protein. CBC and TSH were normal.  He was started on lisinoprilHCTZ 20/12.5 mg and advised to follow-up here. No adverse effects. He remains asymptomatic.  Declines flu vaccine.   Review of Systems No chest pain, SOB, HA, dizziness, vision change, N/V, diarrhea, constipation, dysuria, urinary urgency or frequency, myalgias, arthralgias or rash.   Prior to Admission medications   Medication Sig Start Date End Date Taking? Authorizing Provider  lisinopril-hydrochlorothiazide (ZESTORETIC) 20-12.5 MG tablet Take 1 tablet by mouth daily. 08/12/17  Yes Kristopher Emerald, MD     Allergies  Allergen Reactions  . Bee Venom Swelling     Patient Active Problem List   Diagnosis Date Noted  . Varicose veins of lower extremities with ulcer ([place]) 09/03/2014  . OSA (obstructive sleep apnea) 08/02/2014  . Essential hypertension 05/04/2014  .  Morbid obesity ([place]) 05/04/2014     Family History  Problem Relation Age of Onset  . Cancer Mother 9       lung cancer  . Diabetes Mother   . Stroke Mother   . Heart disease Father   . Mental illness Brother        schizophrenia     Social History   Socioeconomic History  . Marital status: Married    Spouse name: [place]  . Number of children: 2  . Years of education: 16  . Highest education level: Bachelor's degree (e.g., BA, AB, BS)  Social Needs  . Financial resource strain: Not on file  . Food insecurity - worry: Not on file  . Food insecurity - inability: Not on file  . Transportation needs - medical: Not on file  . Transportation needs - non-medical: Not on file  Occupational History  . Occupation: Editor, commissioning: [employer]  Tobacco Use  . Smoking status: Never Smoker  . Smokeless tobacco: Never Used  Substance and Sexual Activity  . Alcohol use: No    Alcohol/week: 0.0 oz  . Drug use: No  . Sexual activity: Yes  Other Topics Concern  . Not on file  Social History Narrative   Lives with his wife and son.   Daughter lives independently nearby.         Objective:  Physical Exam  Constitutional: He is oriented to person, place, and time. He appears well-developed and  well-nourished. He is active and cooperative. No distress.  BP (!) 160/110 (BP Location: Left Arm, Patient Position: Sitting, Cuff Size: Normal)   Pulse 90   Temp 98.9 F (37.2 C) (Oral)   Resp 18   Ht 6' (1.829 m)   Wt 274 lb 6.4 oz (124.5 kg)   SpO2 98%   BMI 37.22 kg/m   HENT:  Head: Normocephalic and atraumatic.  Right Ear: Hearing normal.  Left Ear: Hearing normal.  Eyes: Conjunctivae are normal. No scleral icterus.  Neck: Normal range of motion. Neck supple. No thyromegaly present.  Cardiovascular: Normal rate, regular rhythm and normal heart sounds.  Pulses:      Radial pulses are 2+ on the right side, and 2+ on the left side.  Pulmonary/Chest: Effort normal  and breath sounds normal.  Lymphadenopathy:       Head (right side): No tonsillar, no preauricular, no posterior auricular and no occipital adenopathy present.       Head (left side): No tonsillar, no preauricular, no posterior auricular and no occipital adenopathy present.    He has no cervical adenopathy.       Right: No supraclavicular adenopathy present.       Left: No supraclavicular adenopathy present.  Neurological: He is alert and oriented to person, place, and time. No sensory deficit.  Skin: Skin is warm, dry and intact. No rash noted. No cyanosis or erythema. Nails show no clubbing.  Psychiatric: He has a normal mood and affect. His speech is normal and behavior is normal.             Assessment & Plan:   Problem List Items Addressed This Visit    Essential hypertension    Uncontrolled. INCREASE HCTZ component to 25 mg daily. Healthy eating, regular exercise and CPAP use.      Relevant Medications   lisinopril-hydrochlorothiazide (PRINZIDE,ZESTORETIC) 20-25 MG tablet       Return in about 4 weeks (around 10/07/2017) for re-evaluation of blood pressure, fasting labs, health maintenance.   Fara Chute, PA-C [facility] at [place]

## 2017-09-09 NOTE — Patient Instructions (Signed)
     IF you received an x-ray today, you will receive an invoice from [facility]. Please contact [facility] at [phone] with questions or concerns regarding your invoice.   IF you received labwork today, you will receive an invoice from [facility]. Please contact [facility] at [phone] with questions or concerns regarding your invoice.   Our billing staff will not be able to assist you with questions regarding bills from these companies.  You will be contacted with the lab results as soon as they are available. The fastest way to get your results is to activate your My Chart account. Instructions are located on the last page of this paperwork. If you have not heard from us regarding the results in 2 weeks, please contact this office.     

## 2017-09-19 ENCOUNTER — Encounter: Payer: Self-pay | Admitting: Physician Assistant

## 2017-09-19 NOTE — Assessment & Plan Note (Signed)
Uncontrolled. INCREASE HCTZ component to 25 mg daily. Healthy eating, regular exercise and CPAP use.

## 2017-10-08 ENCOUNTER — Other Ambulatory Visit: Payer: Self-pay | Admitting: Physician Assistant

## 2017-10-08 ENCOUNTER — Telehealth: Payer: Self-pay

## 2017-10-08 ENCOUNTER — Ambulatory Visit (INDEPENDENT_AMBULATORY_CARE_PROVIDER_SITE_OTHER): Payer: BLUE CROSS/BLUE SHIELD | Admitting: Physician Assistant

## 2017-10-08 ENCOUNTER — Encounter: Payer: Self-pay | Admitting: Physician Assistant

## 2017-10-08 ENCOUNTER — Other Ambulatory Visit: Payer: Self-pay

## 2017-10-08 VITALS — BP 160/98 | HR 83 | Temp 98.1°F | Resp 18 | Ht 72.0 in | Wt 271.6 lb

## 2017-10-08 DIAGNOSIS — G4733 Obstructive sleep apnea (adult) (pediatric): Secondary | ICD-10-CM

## 2017-10-08 DIAGNOSIS — Z114 Encounter for screening for human immunodeficiency virus [HIV]: Secondary | ICD-10-CM

## 2017-10-08 DIAGNOSIS — I83893 Varicose veins of bilateral lower extremities with other complications: Secondary | ICD-10-CM

## 2017-10-08 DIAGNOSIS — Z6836 Body mass index (BMI) 36.0-36.9, adult: Secondary | ICD-10-CM | POA: Diagnosis not present

## 2017-10-08 DIAGNOSIS — Z1159 Encounter for screening for other viral diseases: Secondary | ICD-10-CM

## 2017-10-08 DIAGNOSIS — I10 Essential (primary) hypertension: Secondary | ICD-10-CM

## 2017-10-08 DIAGNOSIS — Z1211 Encounter for screening for malignant neoplasm of colon: Secondary | ICD-10-CM

## 2017-10-08 MED ORDER — AMLODIPINE BESYLATE 10 MG PO TABS: 10.0000 mg | ORAL_TABLET | Freq: Every day | ORAL | 3 refills | Status: AC

## 2017-10-08 NOTE — Assessment & Plan Note (Signed)
Will contact the CPAP supplier to better understand the problem with his set up.

## 2017-10-08 NOTE — Assessment & Plan Note (Signed)
Uncontrolled. ADD amlodipine. May not have significant edema, as he is on ACEI and HCTZ. Monitor. RESUME CPAP.

## 2017-10-08 NOTE — Patient Instructions (Addendum)
1. Our office will call [facility] to see what we can sort out to get you back on CPAP. 2. Start the amlodipine. It can cause swelling in your legs. You may not have any, or it may be tolerable, because you are also on the lisinopril-HCTZ.    IF you received an x-ray today, you will receive an invoice from [facility]. Please contact [facility] at [phone] with questions or concerns regarding your invoice.   IF you received labwork today, you will receive an invoice from [facility]. Please contact [facility] at [phone] with questions or concerns regarding your invoice.   Our billing staff will not be able to assist you with questions regarding bills from these companies.  You will be contacted with the lab results as soon as they are available. The fastest way to get your results is to activate your My Chart account. Instructions are located on the last page of this paperwork. If you have not heard from us regarding the results in 2 weeks, please contact this office.

## 2017-10-08 NOTE — Progress Notes (Signed)
Patient ID: Kristopher PattersonJames A Trettel, male    DOB: 1961-05-31, 56 y.o.   MRN: [id number]  PCP: Patient, No Pcp Per  Chief Complaint  Patient presents with  . Hypertension  . Follow-up  . Fasting Labs    Pt states he is fasting.    Subjective:   Presents for evaluation of HTN.  Is tolerating the increased lisinoprilHCTZ dose, but notes that his BP is still elevated. Recalls a medication prescribed in the past that caused ED. He has also declined beta blocker therapy, due to potential fatigue. Chart review reveals that he was started on amlodipine in 2015, during the time he was being treated for a vascular ulcer on the RIGHT leg, and changed to lisinopril due to LE edema.  Isn't using CPAP. Needs new supplies and "prescription has expired." ?[facility]. Old machine. Chip malfunctioning? Not communicating with the company and so cannot document that he is using the machine. As such, his insurance won't cover the supplies. Last sleep study "years" ago.  Bilateral lower extremity varicose veins. LEFT did well following treatment. He has swelling and increased varicosities on the RIGHT (treatment was performed by a different physician). Would like to go back to Dr. Ardyth GalFeatherston for additional evaluation and treatment when he is able.  Hasn't been able to have the root canal yet, due to his elevated BP.   Review of Systems  Constitutional: Negative for chills and fever.  Eyes: Negative for visual disturbance.  Respiratory: Negative for cough and shortness of breath.   Cardiovascular: Negative for chest pain, palpitations and leg swelling.  Gastrointestinal: Negative for diarrhea, nausea and vomiting.  Endocrine: Negative for polydipsia.  Genitourinary: Negative for dysuria, frequency and urgency.  Musculoskeletal: Negative for myalgias.  Skin: Negative for rash.  Neurological: Negative for dizziness and headaches.  Hematological: Negative.        Patient Active Problem  List   Diagnosis Date Noted  . Varicose veins of lower extremities with ulcer ([facility]) 09/03/2014  . OSA (obstructive sleep apnea) 08/02/2014  . Essential hypertension 05/04/2014  . Morbid obesity ([facility]) 05/04/2014     Prior to Admission medications   Medication Sig Start Date End Date Taking? Authorizing Provider  lisinopril-hydrochlorothiazide (PRINZIDE,ZESTORETIC) 20-25 MG tablet Take 1 tablet daily by mouth. 09/09/17  Yes Porfirio OarJeffery, Kamaal Cast, PA-C     Allergies  Allergen Reactions  . Bee Venom Swelling       Objective:  Physical Exam  Constitutional: He is oriented to person, place, and time. He appears well-developed and well-nourished. He is active and cooperative. No distress.  BP (!) 160/98   Pulse 83   Temp 98.1 F (36.7 C) (Oral)   Resp 18   Ht 6' (1.829 m)   Wt 271 lb 9.6 oz (123.2 kg)   SpO2 99%   BMI 36.84 kg/m   HENT:  Head: Normocephalic and atraumatic.  Right Ear: Hearing normal.  Left Ear: Hearing normal.  Eyes: Conjunctivae are normal. No scleral icterus.  Neck: Normal range of motion. Neck supple. No thyromegaly present.  Cardiovascular: Normal rate, regular rhythm and normal heart sounds.  Pulses:      Radial pulses are 2+ on the right side, and 2+ on the left side.  Pulmonary/Chest: Effort normal and breath sounds normal.  Lymphadenopathy:       Head (right side): No tonsillar, no preauricular, no posterior auricular and no occipital adenopathy present.       Head (left side): No tonsillar, no preauricular,  no posterior auricular and no occipital adenopathy present.    He has no cervical adenopathy.       Right: No supraclavicular adenopathy present.       Left: No supraclavicular adenopathy present.  Neurological: He is alert and oriented to person, place, and time. No sensory deficit.  Skin: Skin is warm, dry and intact. No rash noted. No cyanosis or erythema. Nails show no clubbing.  Psychiatric: He has a normal mood and affect. His speech is normal  and behavior is normal.           Assessment & Plan:   Problem List Items Addressed This Visit    Essential hypertension - Primary    Uncontrolled. ADD amlodipine. May not have significant edema, as he is on ACEI and HCTZ. Monitor. RESUME CPAP.      Relevant Medications   amLODipine (NORVASC) 10 MG tablet   Other Relevant Orders   CBC with Differential/Platelet   Comprehensive metabolic panel   TSH   BMI 36.0-36.9,adult    Await labs. CPAP.  Healthy eating. Increased exercise.      Relevant Orders   Hemoglobin A1c   Lipid panel   OSA (obstructive sleep apnea)    Will contact the CPAP supplier to better understand the problem with his set up.       Varicose veins of leg with swelling, bilateral    Once he has controlled BP, and has his tooth addressed, he'll want to go back to see Dr. Ardyth GalFeatherston.      Relevant Medications   amLODipine (NORVASC) 10 MG tablet    Other Visit Diagnoses    Screening for HIV (human immunodeficiency virus)       Relevant Orders   HIV antibody   Need for hepatitis C screening test       Relevant Orders   Hepatitis C antibody   Encounter for screening colonoscopy       Relevant Orders   Ambulatory referral to Gastroenterology       Return in about 4 weeks (around 11/05/2017) for re-evalaution of blood pressure.   Fernande Brashelle S. Oakleigh Hesketh, PA-C Primary Care at [facility]

## 2017-10-08 NOTE — Progress Notes (Signed)
Per [facility], they just need a new order for CPAP and supplies.  Will fax order, but need settings. Will pull paper record.  Paper record reviewed. No Sleep study or titration found. LM to return call regarding location of previous study. If unable to  Remember, will need new titration study.

## 2017-10-08 NOTE — Assessment & Plan Note (Signed)
Await labs. CPAP.  Healthy eating. Increased exercise.

## 2017-10-08 NOTE — Assessment & Plan Note (Signed)
Once he has controlled BP, and has his tooth addressed, he'll want to go back to see Dr. Ardyth GalFeatherston.

## 2017-10-08 NOTE — Addendum Note (Signed)
Addended by: Roxanne GatesSTEVENSON, Shivank Pinedo M on: 10/08/2017 11:53 AM   Modules accepted: Orders

## 2017-10-09 LAB — COMPREHENSIVE METABOLIC PANEL
AG RATIO: 1.7 (calc) (ref 1.0–2.5)
ALBUMIN MSPROF: 4.2 g/dL (ref 3.6–5.1)
ALT: 18 U/L (ref 9–46)
AST: 29 U/L (ref 10–35)
Alkaline phosphatase (APISO): 39 U/L — ABNORMAL LOW (ref 40–115)
BUN / CREAT RATIO: 20 (calc) (ref 6–22)
BUN: 27 mg/dL — ABNORMAL HIGH (ref 7–25)
CHLORIDE: 101 mmol/L (ref 98–110)
CO2: 29 mmol/L (ref 20–32)
CREATININE: 1.37 mg/dL — AB (ref 0.70–1.33)
Calcium: 9.5 mg/dL (ref 8.6–10.3)
GLUCOSE: 85 mg/dL (ref 65–99)
Globulin: 2.5 g/dL (calc) (ref 1.9–3.7)
POTASSIUM: 3.6 mmol/L (ref 3.5–5.3)
Sodium: 138 mmol/L (ref 135–146)
Total Bilirubin: 1 mg/dL (ref 0.2–1.2)
Total Protein: 6.7 g/dL (ref 6.1–8.1)

## 2017-10-09 LAB — LIPID PANEL
CHOL/HDL RATIO: 2.6 (calc) (ref <5.0)
Cholesterol: 180 mg/dL (ref <200)
HDL: 68 mg/dL (ref >40)
LDL Cholesterol (Calc): 98 mg/dL (calc)
NON-HDL CHOLESTEROL (CALC): 112 mg/dL (ref <130)
Triglycerides: 49 mg/dL (ref <150)

## 2017-10-09 LAB — CBC WITH DIFFERENTIAL/PLATELET
BASOS PCT: 1.7 %
Basophils Absolute: 49 cells/uL (ref 0–200)
Eosinophils Absolute: 128 cells/uL (ref 15–500)
Eosinophils Relative: 4.4 %
HCT: 43.7 % (ref 38.5–50.0)
HEMOGLOBIN: 14.9 g/dL (ref 13.2–17.1)
Lymphs Abs: 1175 cells/uL (ref 850–3900)
MCH: 30.8 pg (ref 27.0–33.0)
MCHC: 34.1 g/dL (ref 32.0–36.0)
MCV: 90.3 fL (ref 80.0–100.0)
MONOS PCT: 10.2 %
MPV: 9.8 fL (ref 7.5–12.5)
NEUTROS ABS: 1253 {cells}/uL — AB (ref 1500–7800)
Neutrophils Relative %: 43.2 %
PLATELETS: 229 10*3/uL (ref 140–400)
RBC: 4.84 10*6/uL (ref 4.20–5.80)
RDW: 12.8 % (ref 11.0–15.0)
TOTAL LYMPHOCYTE: 40.5 %
WBC: 2.9 10*3/uL — AB (ref 3.8–10.8)
WBCMIX: 296 {cells}/uL (ref 200–950)

## 2017-10-09 LAB — HEMOGLOBIN A1C
Hgb A1c MFr Bld: 5.7 % of total Hgb — ABNORMAL HIGH (ref <5.7)
Mean Plasma Glucose: 117 (calc)
eAG (mmol/L): 6.5 (calc)

## 2017-10-09 LAB — HEPATITIS C ANTIBODY
HEP C AB: NONREACTIVE
SIGNAL TO CUT-OFF: 0.02 (ref <1.00)

## 2017-10-09 LAB — TSH: TSH: 0.52 m[IU]/L (ref 0.40–4.50)

## 2017-10-29 NOTE — Telephone Encounter (Signed)
error 

## 2017-11-12 ENCOUNTER — Encounter: Payer: Self-pay | Admitting: Physician Assistant

## 2017-11-12 ENCOUNTER — Other Ambulatory Visit: Payer: Self-pay

## 2017-11-12 ENCOUNTER — Ambulatory Visit (INDEPENDENT_AMBULATORY_CARE_PROVIDER_SITE_OTHER): Payer: BLUE CROSS/BLUE SHIELD | Admitting: Physician Assistant

## 2017-11-12 VITALS — BP 130/96 | HR 96 | Temp 98.9°F | Resp 18 | Ht 73.0 in | Wt 280.6 lb

## 2017-11-12 DIAGNOSIS — G4733 Obstructive sleep apnea (adult) (pediatric): Secondary | ICD-10-CM | POA: Diagnosis not present

## 2017-11-12 DIAGNOSIS — I10 Essential (primary) hypertension: Secondary | ICD-10-CM

## 2017-11-12 DIAGNOSIS — I83893 Varicose veins of bilateral lower extremities with other complications: Secondary | ICD-10-CM

## 2017-11-12 NOTE — Progress Notes (Signed)
Patient ID: Kristopher Garcia, male    DOB: 18-Nov-1960, 57 y.o.   MRN: [id number]  PCP: Patient, No Pcp Per  Chief Complaint  Patient presents with  . Hypertension  . Follow-up    Subjective:   Presents for evaluation of hypertension.  Patient was last seen in Dec. 2018 for uncontrolled HTN. Prior to this visit, he was sent to the emergency room in Oct 2018 due to a SBP >200. His blood pressure at the ED was recorded at 212/121. He was started on Lisinopril/HCTZ at that time.   At his last office visit, Amlodipine was added to the treatment regimen. He declined a beta blocker at this visit due to risk of fatigue. He was told to resume his CPAP at this time. A referral was sent to Gastroenterology for a colonoscopy.   He has not had any adverse reactions with the addition of Amlodipine. He denies any dizziness, HA, nausea, vomiting, change in bowel habits/urination, or erectile dysfunction. He has noticed swelling in his lower legs, but does not relate this to the addition of Amlodipine. He is on his feet for >8 hours a day with work. He wears compression socks daily which helps. He is interested and seeking treatment for his varicose veins. He has had treatment years ago, which was successful. He also reports a slight cough, which is not bothersome and he is unsure if it is related to medications or his working environment.  His last CPAP prescription was about 2-3 years ago. At that time, he was having difficulty with [facility] card in the machine, which he believes led to insurance problems and lack of coverage for the machine. He believes he was "lost in the system." His last sleep study was about 4 years ago.   Patient has been playing phone tag with the Gastroenterologist. He notes he has little time at work to schedule an appointment and when he does call after work the office is closed. He is willing to get his colonoscopy scheduled.      Review of Systems See  above.    Patient Active Problem List   Diagnosis Date Noted  . Varicose veins of leg with swelling, bilateral 10/08/2017  . OSA (obstructive sleep apnea) 08/02/2014  . Essential hypertension 05/04/2014  . BMI 36.0-36.9,adult 05/04/2014     Prior to Admission medications   Medication Sig Start Date End Date Taking? Authorizing Provider  amLODipine (NORVASC) 10 MG tablet Take 1 tablet (10 mg total) by mouth daily. 10/08/17  Yes Brayley Mackowiak, PA-C  lisinopril-hydrochlorothiazide (PRINZIDE,ZESTORETIC) 20-25 MG tablet Take 1 tablet daily by mouth. 09/09/17  Yes Porfirio OarJeffery, Krissi Willaims, PA-C     Allergies  Allergen Reactions  . Bee Venom Swelling       Objective:  Physical Exam  Constitutional: He is oriented to person, place, and time. He appears well-developed and well-nourished.  Neck: Normal range of motion.  Cardiovascular: Normal rate, regular rhythm and normal heart sounds.  Pulses:      Radial pulses are 2+ on the right side, and 2+ on the left side.       Posterior tibial pulses are 2+ on the right side, and 2+ on the left side.  Pulmonary/Chest: Effort normal and breath sounds normal.  Musculoskeletal: He exhibits edema (trace pitting edema R>L).  Neurological: He is alert and oriented to person, place, and time.  Psychiatric: He has a normal mood and affect. His behavior is normal.    BP Marland Kitchen(!)  130/96   Pulse 96   Temp 98.9 F (37.2 C) (Oral)   Resp 18   Ht 6\' 1"  (1.854 m)   Wt 280 lb 9.6 oz (127.3 kg)   SpO2 99%   BMI 37.02 kg/m           Assessment & Plan:   1. Essential hypertension Continue current medications. Monitor cough. If cough becomes more persistent / bothersome, will consider switching from ACEi to ARB.   2. Varicose veins of leg with swelling, bilateral Continue use of compression socks.   3. OSA (obstructive sleep apnea)Patient will do some research to obtain records from his last sleep study and find the provider who prescribed his CPAP. We  will speak to [facility] to find out more details of his past providers. If patient has not heard anything in a week, advised to follow-up with our office. Discussed referral to [facility]. Our main goal is to get him back on treatment with his CPAP machine.   Patient to follow-up in 3 months for re-evaluation of blood pressure.    Alfonse Alpers, PA-S

## 2017-11-12 NOTE — Patient Instructions (Addendum)
Continue current medications for blood pressure.  Getting back on CPAP is our TOP priority!   IF you received an x-ray today, you will receive an invoice from [facility]. Please contact [facility] at [phone] with questions or concerns regarding your invoice.   IF you received labwork today, you will receive an invoice from [facility]. Please contact [facility] at [phone] with questions or concerns regarding your invoice.   Our billing staff will not be able to assist you with questions regarding bills from these companies.  You will be contacted with the lab results as soon as they are available. The fastest way to get your results is to activate your My Chart account. Instructions are located on the last page of this paperwork. If you have not heard from us regarding the results in 2 weeks, please contact this office.

## 2017-11-15 ENCOUNTER — Encounter: Payer: Self-pay | Admitting: Physician Assistant

## 2018-02-11 ENCOUNTER — Ambulatory Visit (INDEPENDENT_AMBULATORY_CARE_PROVIDER_SITE_OTHER): Payer: BLUE CROSS/BLUE SHIELD | Admitting: Physician Assistant

## 2018-02-11 ENCOUNTER — Encounter: Payer: Self-pay | Admitting: Physician Assistant

## 2018-02-11 ENCOUNTER — Other Ambulatory Visit: Payer: Self-pay

## 2018-02-11 VITALS — BP 154/92 | HR 84 | Temp 98.4°F | Resp 18 | Ht 72.95 in | Wt 268.0 lb

## 2018-02-11 DIAGNOSIS — I10 Essential (primary) hypertension: Secondary | ICD-10-CM

## 2018-02-11 DIAGNOSIS — Z1211 Encounter for screening for malignant neoplasm of colon: Secondary | ICD-10-CM | POA: Diagnosis not present

## 2018-02-11 DIAGNOSIS — G4733 Obstructive sleep apnea (adult) (pediatric): Secondary | ICD-10-CM

## 2018-02-11 MED ORDER — LISINOPRIL 10 MG PO TABS: 10.0000 mg | ORAL_TABLET | Freq: Every day | ORAL | 3 refills | Status: AC

## 2018-02-11 MED ORDER — LISINOPRIL-HYDROCHLOROTHIAZIDE 20-25 MG PO TABS: 1.0000 | ORAL_TABLET | Freq: Every day | ORAL | 3 refills | Status: AC

## 2018-02-11 NOTE — Progress Notes (Signed)
Patient ID: Kristopher Garcia, male    DOB: 1961/09/16, 57 y.o.   MRN: [id number]  PCP: Patient, No Pcp Per  Chief Complaint  Patient presents with  . Hypertension    f/u    Subjective:   Presents for evaluation of hypertension.  He is doing well. No problems or concerns. Reports healthier eating choices since his last visit in December.  Has sleep apnea, not using CPAP.  Lost to follow-up after his visit in December.    Review of Systems  Constitutional: Negative for chills and fever.  Respiratory: Negative for cough and shortness of breath.   Cardiovascular: Negative for chest pain, palpitations and leg swelling.  Gastrointestinal: Negative for diarrhea, nausea and vomiting.  Endocrine: Negative for polydipsia.  Genitourinary: Negative for dysuria, frequency and urgency.  Musculoskeletal: Negative for myalgias.  Skin: Negative for rash.  Neurological: Negative for dizziness and headaches.       Patient Active Problem List   Diagnosis Date Noted  . Varicose veins of leg with swelling, bilateral 10/08/2017  . OSA (obstructive sleep apnea) 08/02/2014  . Essential hypertension 05/04/2014  . BMI 36.0-36.9,adult 05/04/2014     Prior to Admission medications   Medication Sig Start Date End Date Taking? Authorizing Provider  amLODipine (NORVASC) 10 MG tablet Take 1 tablet (10 mg total) by mouth daily. 10/08/17  Yes Tayshun Gappa, PA-C  lisinopril-hydrochlorothiazide (PRINZIDE,ZESTORETIC) 20-25 MG tablet Take 1 tablet by mouth daily. 02/11/18  Yes Porfirio Oar, PA-C     Allergies  Allergen Reactions  . Bee Venom Swelling       Objective:  Physical Exam  Constitutional: He is oriented to person, place, and time. He appears well-developed and well-nourished. He is active and cooperative. No distress.  BP (!) 154/92 (BP Location: Right Arm)   Pulse 84   Temp 98.4 F (36.9 C) (Oral)   Resp 18   Ht 6' 0.95" (1.853 m)   Wt 268 lb (121.6 kg)   SpO2 97%    BMI 35.40 kg/m   HENT:  Head: Normocephalic and atraumatic.  Right Ear: Hearing normal.  Left Ear: Hearing normal.  Eyes: Conjunctivae are normal. No scleral icterus.  Neck: Normal range of motion. Neck supple. No thyromegaly present.  Cardiovascular: Normal rate, regular rhythm and normal heart sounds.  Pulses:      Radial pulses are 2+ on the right side, and 2+ on the left side.  Pulmonary/Chest: Effort normal and breath sounds normal.  Lymphadenopathy:       Head (right side): No tonsillar, no preauricular, no posterior auricular and no occipital adenopathy present.       Head (left side): No tonsillar, no preauricular, no posterior auricular and no occipital adenopathy present.    He has no cervical adenopathy.       Right: No supraclavicular adenopathy present.       Left: No supraclavicular adenopathy present.  Neurological: He is alert and oriented to person, place, and time. No sensory deficit.  Skin: Skin is warm, dry and intact. No rash noted. No cyanosis or erythema. Nails show no clubbing.  Psychiatric: He has a normal mood and affect. His speech is normal and behavior is normal.           Assessment & Plan:   Problem List Items Addressed This Visit    Essential hypertension - Primary    Uncontrolled.  Continue lisinopril HCTZ 20-25 daily and amlodipine 10 mg daily.  Add lisinopril 10 mg daily.  Relevant Medications   lisinopril-hydrochlorothiazide (PRINZIDE,ZESTORETIC) 20-25 MG tablet   lisinopril (PRINIVIL,ZESTRIL) 10 MG tablet   OSA (obstructive sleep apnea)    Update sleep study with titration.      Relevant Orders   Ambulatory referral to Sleep Studies    Other Visit Diagnoses    Screening for colon cancer       Relevant Orders   Ambulatory referral to Gastroenterology       Return in about 6 weeks (around 03/25/2018) for re-evalaution of blood pressure.   Fernande Brashelle S. Teylor Wolven, PA-C Primary Care at [facility]

## 2018-02-11 NOTE — Progress Notes (Signed)
Subjective:    Patient ID: Kristopher Garcia, male    DOB: [birth year], 57 y.o.   MRN: [id number] Chief Complaint  Patient presents with  . Hypertension    f/u    HPI   57 yo male presents for high blood pressure follow up.  Reports no concerns at this time, has been eating healthier and doing well.  Review of Systems  Constitutional: Negative.   HENT: Negative.   Eyes: Negative.   Respiratory: Negative.   Cardiovascular: Negative.   Gastrointestinal: Negative.   Endocrine: Negative.   Genitourinary: Negative.   Musculoskeletal: Negative.   Skin: Negative.   Allergic/Immunologic: Negative.   Neurological: Negative.   Hematological: Negative.   Psychiatric/Behavioral: Negative.     Patient Active Problem List   Diagnosis Date Noted  . Varicose veins of leg with swelling, bilateral 10/08/2017  . OSA (obstructive sleep apnea) 08/02/2014  . Essential hypertension 05/04/2014  . BMI 36.0-36.9,adult 05/04/2014   Past Medical History:  Diagnosis Date  . Allergy   . Dyslipidemia   . Hypertension   . OA (osteoarthritis)   . Obesity   . Obstructive sleep apnea   . Varicose veins of lower extremities with ulcer (HCC) 09/03/2014   Prior to Admission medications   Medication Sig Start Date End Date Taking? Authorizing Provider  amLODipine (NORVASC) 10 MG tablet Take 1 tablet (10 mg total) by mouth daily. 10/08/17  Yes Jeffery, Chelle, PA-C  lisinopril-hydrochlorothiazide (PRINZIDE,ZESTORETIC) 20-25 MG tablet Take 1 tablet daily by mouth. 09/09/17  Yes Porfirio OarJeffery, Chelle, PA-C   Allergies  Allergen Reactions  . Bee Venom Swelling       Objective:   Physical Exam  Constitutional: He is oriented to person, place, and time. He appears well-developed and well-nourished. No distress.  BP (!) 150/86 (BP Location: Left Arm, Patient Position: Sitting, Cuff Size: Large)   Pulse 84   Temp 98.4 F (36.9 C) (Oral)   Resp 18   Ht 6' 0.95" (1.853 m)   Wt 268 lb (121.6 kg)   SpO2  97%   BMI 35.40 kg/m    HENT:  Head: Normocephalic and atraumatic.  Nose: Nose normal.  Eyes: Pupils are equal, round, and reactive to light. Conjunctivae and EOM are normal. Right eye exhibits no discharge. Left eye exhibits no discharge.  Neck: Normal range of motion. Neck supple. No tracheal deviation present. No thyromegaly present.  Cardiovascular: Normal rate, regular rhythm and intact distal pulses. Exam reveals no gallop and no friction rub.  No murmur heard. Pulses:      Radial pulses are 2+ on the right side.       Dorsalis pedis pulses are 2+ on the right side.       Posterior tibial pulses are 2+ on the right side.  Pulmonary/Chest: Effort normal and breath sounds normal. No respiratory distress. He has no wheezes. He has no rales.  Musculoskeletal: Normal range of motion. He exhibits no edema.  Neurological: He is alert and oriented to person, place, and time. He has normal reflexes.  Skin: Skin is warm and dry. He is not diaphoretic. No erythema.  Psychiatric: He has a normal mood and affect. His behavior is normal.        Assessment & Plan:  1. Essential hypertension HTN uncontrolled on current medications, try adding 10mg  lisinopril. Will f/u in 6 weeks to recheck. - lisinopril-hydrochlorothiazide (PRINZIDE,ZESTORETIC) 20-25 MG tablet; Take 1 tablet by mouth daily.  Dispense: 90 tablet; Refill: 3 - lisinopril (PRINIVIL,ZESTRIL)  10 MG tablet; Take 1 tablet (10 mg total) by mouth daily.  Dispense: 90 tablet; Refill: 3  2. OSA (obstructive sleep apnea) - Ambulatory referral to Sleep Studies  3. Screening for colon cancer - Ambulatory referral to Gastroenterology   Return in about 6 weeks (around 03/25/2018) for re-evalaution of blood pressure.

## 2018-02-11 NOTE — Patient Instructions (Signed)
     IF you received an x-ray today, you will receive an invoice from [facility]. Please contact [facility] at [phone] with questions or concerns regarding your invoice.   IF you received labwork today, you will receive an invoice from [facility]. Please contact [facility] at [phone] with questions or concerns regarding your invoice.   Our billing staff will not be able to assist you with questions regarding bills from these companies.  You will be contacted with the lab results as soon as they are available. The fastest way to get your results is to activate your My Chart account. Instructions are located on the last page of this paperwork. If you have not heard from us regarding the results in 2 weeks, please contact this office.     

## 2018-03-07 ENCOUNTER — Encounter: Payer: Self-pay | Admitting: *Deleted

## 2018-03-09 ENCOUNTER — Encounter: Payer: Self-pay | Admitting: Physician Assistant

## 2018-03-18 ENCOUNTER — Encounter: Payer: Self-pay | Admitting: Physician Assistant

## 2018-03-18 DIAGNOSIS — K2100 Gastro-esophageal reflux disease with esophagitis, without bleeding: Secondary | ICD-10-CM | POA: Insufficient documentation

## 2018-03-18 DIAGNOSIS — K449 Diaphragmatic hernia without obstruction or gangrene: Secondary | ICD-10-CM | POA: Insufficient documentation

## 2018-03-20 NOTE — Assessment & Plan Note (Signed)
Update sleep study with titration.

## 2018-03-20 NOTE — Assessment & Plan Note (Signed)
Uncontrolled.  Continue lisinopril HCTZ 20-25 daily and amlodipine 10 mg daily.  Add lisinopril 10 mg daily.

## 2018-03-30 ENCOUNTER — Other Ambulatory Visit: Payer: Self-pay

## 2018-03-30 ENCOUNTER — Ambulatory Visit (INDEPENDENT_AMBULATORY_CARE_PROVIDER_SITE_OTHER): Payer: BLUE CROSS/BLUE SHIELD | Admitting: Physician Assistant

## 2018-03-30 ENCOUNTER — Encounter: Payer: Self-pay | Admitting: Physician Assistant

## 2018-03-30 VITALS — BP 140/88 | HR 88 | Temp 98.9°F | Resp 16 | Ht 72.68 in | Wt 264.2 lb

## 2018-03-30 DIAGNOSIS — I10 Essential (primary) hypertension: Secondary | ICD-10-CM | POA: Diagnosis not present

## 2018-03-30 DIAGNOSIS — Z6835 Body mass index (BMI) 35.0-35.9, adult: Secondary | ICD-10-CM

## 2018-03-30 NOTE — Assessment & Plan Note (Signed)
Borderline controlled. Will improve with continued weight loss and treatment of OSA. Continue current treatment.

## 2018-03-30 NOTE — Progress Notes (Signed)
Patient ID: Kristopher Garcia, male    DOB: 06/29/1961, 57 y.o.   MRN: [id number]  PCP: Porfirio Oar, PA-C  Chief Complaint  Patient presents with  . Hypertension    follow up     Subjective:   Presents for evaluation of HTN.  At his last visit, lisinopril was added to lisinoprilHCT and amlodipine. He is tolerating it well, without adverse effects. His visit with sleep medicine is 7/15 with Dr. Frances Furbish. UGI and Colonoscopy on 03/09/2018 with Dr. Loreta Ave. Esophagitis noted. He is taking omeprazole and sucralfate.     Review of Systems  Constitutional: Negative for chills and fever.  Respiratory: Negative for cough and shortness of breath.   Cardiovascular: Negative for chest pain, palpitations and leg swelling.  Gastrointestinal: Negative for diarrhea, nausea and vomiting.  Endocrine: Negative for polydipsia.  Genitourinary: Negative for dysuria, frequency and urgency.  Musculoskeletal: Negative for myalgias.  Skin: Negative for rash.  Neurological: Negative for dizziness and headaches.       Patient Active Problem List   Diagnosis Date Noted  . Reflux esophagitis 03/18/2018  . Hiatal hernia 03/18/2018  . Varicose veins of leg with swelling, bilateral 10/08/2017  . OSA (obstructive sleep apnea) 08/02/2014  . Essential hypertension 05/04/2014  . BMI 36.0-36.9,adult 05/04/2014     Prior to Admission medications   Medication Sig Start Date End Date Taking? Authorizing Provider  amLODipine (NORVASC) 10 MG tablet Take 1 tablet (10 mg total) by mouth daily. 10/08/17  Yes Tywana Robotham, PA-C  lisinopril (PRINIVIL,ZESTRIL) 10 MG tablet Take 1 tablet (10 mg total) by mouth daily. 02/11/18  Yes Bradey Luzier, PA-C  lisinopril-hydrochlorothiazide (PRINZIDE,ZESTORETIC) 20-25 MG tablet Take 1 tablet by mouth daily. 02/11/18  Yes Fanchon Papania, PA-C  omeprazole (PRILOSEC) 40 MG capsule Take 40 mg by mouth daily. 03/09/18  Yes [provider]  sucralfate (CARAFATE) 1  g tablet TAKE 1 TABLET BY MOUTH 4 TIMES A DAY (BETWEEN MEALS AND AT BEDTIME) 03/09/18  Yes [provider]     Allergies  Allergen Reactions  . Bee Venom Swelling       Objective:  Physical Exam  Constitutional: He is oriented to person, place, and time. He appears well-developed and well-nourished. He is active and cooperative. No distress.  BP 140/88   Pulse 88   Temp 98.9 F (37.2 C)   Resp 16   Ht 6' 0.68" (1.846 m)   Wt 264 lb 3.2 oz (119.8 kg)   SpO2 97%   BMI 35.17 kg/m   HENT:  Head: Normocephalic and atraumatic.  Right Ear: Hearing normal.  Left Ear: Hearing normal.  Eyes: Conjunctivae are normal. No scleral icterus.  Neck: Normal range of motion. Neck supple. No thyromegaly present.  Cardiovascular: Normal rate, regular rhythm and normal heart sounds.  Pulses:      Radial pulses are 2+ on the right side, and 2+ on the left side.  Mild LE edema, bilateral, in compression stockings.  Pulmonary/Chest: Effort normal and breath sounds normal.  Lymphadenopathy:       Head (right side): No tonsillar, no preauricular, no posterior auricular and no occipital adenopathy present.       Head (left side): No tonsillar, no preauricular, no posterior auricular and no occipital adenopathy present.    He has no cervical adenopathy.       Right: No supraclavicular adenopathy present.       Left: No supraclavicular adenopathy present.  Neurological: He is alert and oriented to  person, place, and time. No sensory deficit.  Skin: Skin is warm, dry and intact. No rash noted. No cyanosis or erythema. Nails show no clubbing.  Psychiatric: He has a normal mood and affect. His speech is normal and behavior is normal.       Wt Readings from Last 3 Encounters:  03/30/18 264 lb 3.2 oz (119.8 kg)  02/11/18 268 lb (121.6 kg)  11/12/17 280 lb 9.6 oz (127.3 kg)       Assessment & Plan:   Problem List Items Addressed This Visit    Essential hypertension - Primary     Borderline controlled. Will improve with continued weight loss and treatment of OSA. Continue current treatment.      BMI 35.0-35.9,adult    Has lost 4 lbs. Continue healthy eating and increased exercise. CPAP will likely help.          Return in about 4 months (around 07/31/2018) for re-evalaution of blood pressure and fasting labs.   Fernande Bras, PA-C [facility]

## 2018-03-30 NOTE — Patient Instructions (Addendum)
Go ahead and call [facility] to schedule your next visit with me there. [phone].   IF you received an x-ray today, you will receive an invoice from [facility]. Please contact [facility] at [phone] with questions or concerns regarding your invoice.   IF you received labwork today, you will receive an invoice from [facility]. Please contact [facility] at [phone] with questions or concerns regarding your invoice.   Our billing staff will not be able to assist you with questions regarding bills from these companies.  You will be contacted with the lab results as soon as they are available. The fastest way to get your results is to activate your My Chart account. Instructions are located on the last page of this paperwork. If you have not heard from us regarding the results in 2 weeks, please contact this office.     

## 2018-03-30 NOTE — Assessment & Plan Note (Signed)
Has lost 4 lbs. Continue healthy eating and increased exercise. CPAP will likely help.

## 2018-04-06 ENCOUNTER — Institutional Professional Consult (permissible substitution): Payer: BLUE CROSS/BLUE SHIELD | Admitting: Neurology

## 2018-05-16 ENCOUNTER — Ambulatory Visit (INDEPENDENT_AMBULATORY_CARE_PROVIDER_SITE_OTHER): Payer: BLUE CROSS/BLUE SHIELD | Admitting: Neurology

## 2018-05-16 ENCOUNTER — Encounter: Payer: Self-pay | Admitting: Neurology

## 2018-05-16 ENCOUNTER — Institutional Professional Consult (permissible substitution): Payer: BLUE CROSS/BLUE SHIELD | Admitting: Neurology

## 2018-05-16 VITALS — BP 159/102 | HR 80 | Ht 73.0 in | Wt 267.0 lb

## 2018-05-16 DIAGNOSIS — E669 Obesity, unspecified: Secondary | ICD-10-CM | POA: Diagnosis not present

## 2018-05-16 DIAGNOSIS — G4733 Obstructive sleep apnea (adult) (pediatric): Secondary | ICD-10-CM

## 2018-05-16 DIAGNOSIS — R600 Localized edema: Secondary | ICD-10-CM

## 2018-05-16 DIAGNOSIS — G4719 Other hypersomnia: Secondary | ICD-10-CM

## 2018-05-16 NOTE — Progress Notes (Signed)
Subjective:    Patient ID: Kristopher Garcia is a 57 y.o. male.  HPI     Huston Foley, MD, PhD [facility] [address], [address] [address] [place], [place] [address]  Dear Avelino Leeds,  I saw your patient, Kristopher Garcia, upon your kind request in my neurologic clinic today for initial consultation of his obstructive sleep apnea, in particular for reevaluation of his prior diagnosis of OSA. The patient is unaccompanied today. As you know, Kristopher Garcia is a 57 year old right-handed gentleman with an underlying medical history of hypertension and obesity, who was previously diagnosed with obstructive sleep apnea and placed on CPAP therapy. His Epworth sleepiness score is 12 out of 24, fatigue score is 12 out of 63. Prior sleep study results are not available for my review today. I reviewed your office note from 03/30/2018. He has not used his CPAP machine for the past 2-3 years, he has not received any supplies. Prior DME company was [facility]. A CPAP download was not available from his machine which is a ResMed S9. He recalls that he was on a pressure of 13 cm. The SD card is stuck in his machine. He benefited from using CPAP in the past as he recalls. He has been working on weight loss in the past years. He has lower extremity swelling for which he uses compression stockings. He is trying to make lifestyle changes to improve his blood pressure and weight. He is married and lives with his wife and son. He has a total of 2 children, he works in Data processing manager at [employer]. He has to be at work at 6 AM and works still 2:30 PM, bedtime generally between 8 and 10 PM and Fridays time around 3:30 AM. He does not always achieve his desired bedtime and is sometimes sleep deprived he admits. He is not aware of any family history of sleep apnea. He did not have a tonsillectomy. He had neck surgery in 2005 and shoulder surgery in 2013 or thereabouts. He is a nonsmoker and does not typically  utilize alcohol except for very occasionally, he drinks caffeine in the form of coffee, 2-3 cups per day on average.  His Past Medical History Is Significant For: Past Medical History:  Diagnosis Date  . Allergy   . Dyslipidemia   . Hypertension   . OA (osteoarthritis)   . Obesity   . Obstructive sleep apnea   . Varicose veins of lower extremities with ulcer (HCC) 09/03/2014    His Past Surgical History Is Significant For: Past Surgical History:  Procedure Laterality Date  . CARDIAC CATHETERIZATION  06/21/12  . LEFT HEART CATHETERIZATION WITH CORONARY ANGIOGRAM N/A 06/21/2012   Procedure: LEFT HEART CATHETERIZATION WITH CORONARY ANGIOGRAM;  Surgeon: Donato Schultz, MD;  Location[facility] CATH LAB;  Service: Cardiovascular;  Laterality: N/A;  . NECK SURGERY    . ROTATOR CUFF REPAIR Right   . SPINE SURGERY      His Family History Is Significant For: Family History  Problem Relation Age of Onset  . Cancer Mother 58       lung cancer  . Diabetes Mother   . Stroke Mother   . Heart disease Father   . Mental illness Brother        schizophrenia    His Social History Is Significant For: Social History   Socioeconomic History  . Marital status: Married    Spouse name: [place]  . Number of children: 2  . Years of education:  16  . Highest education level: Bachelor's degree (e.g., BA, AB, BS)  Occupational History  . Occupation: Quarry managerLogistics    Employer: [employer]  Social Needs  . Financial resource strain: Not on file  . Food insecurity:    Worry: Not on file    Inability: Not on file  . Transportation needs:    Medical: Not on file    Non-medical: Not on file  Tobacco Use  . Smoking status: Never Smoker  . Smokeless tobacco: Never Used  Substance and Sexual Activity  . Alcohol use: No    Alcohol/week: 0.0 oz  . Drug use: No  . Sexual activity: Yes  Lifestyle  . Physical activity:    Days per week: Not on file    Minutes per session: Not on file  . Stress: Not on file   Relationships  . Social connections:    Talks on phone: Not on file    Gets together: Not on file    Attends religious service: Not on file    Active member of club or organization: Not on file    Attends meetings of clubs or organizations: Not on file    Relationship status: Not on file  Other Topics Concern  . Not on file  Social History Narrative   Lives with his wife and son.   Daughter lives independently nearby.    His Allergies Are:  Allergies  Allergen Reactions  . Bee Venom Swelling  :   His Current Medications Are:  Outpatient Encounter Medications as of 05/16/2018  Medication Sig  . amLODipine (NORVASC) 10 MG tablet Take 1 tablet (10 mg total) by mouth daily.  Marland Kitchen. lisinopril (PRINIVIL,ZESTRIL) 10 MG tablet Take 1 tablet (10 mg total) by mouth daily.  Marland Kitchen. lisinopril-hydrochlorothiazide (PRINZIDE,ZESTORETIC) 20-25 MG tablet Take 1 tablet by mouth daily.  Marland Kitchen. omeprazole (PRILOSEC) 40 MG capsule Take 40 mg by mouth daily.  . sucralfate (CARAFATE) 1 g tablet TAKE 1 TABLET BY MOUTH 4 TIMES A DAY (BETWEEN MEALS AND AT BEDTIME)   No facility-administered encounter medications on file as of 05/16/2018.   :  Review of Systems:  Out of a complete 14 point review of systems, all are reviewed and negative with the exception of these symptoms as listed below: Review of Systems  Neurological:       Pt presents today to discuss his sleep. Pt had his last sleep study about 5-6 years ago. Pt has a cpap but has not used it in 2-3 years. Pt used to use [facility]. Pt's machine is unable to be downloaded today.  Epworth Sleepiness Scale 0= would never doze 1= slight chance of dozing 2= moderate chance of dozing 3= high chance of dozing  Sitting and reading: 2 Watching TV: 1 Sitting inactive in a public place (ex. Theater or meeting): 2 As a passenger in a car for an hour without a break: 2 Lying down to rest in the afternoon: 2 Sitting and talking to someone: 1 Sitting quietly after lunch  (no alcohol): 2 In a car, while stopped in traffic: 0 Total: 12     Objective:  Neurological Exam  Physical Exam Physical Examination:   Vitals:   05/16/18 1541  BP: (!) 159/102  Pulse: 80   General Examination: The patient is a very pleasant 57 y.o. male in no acute distress. He appears well-developed and well-nourished and well groomed.   HEENT: Normocephalic, atraumatic, pupils are equal, round and reactive to light and accommodation. Extraocular tracking is good  without limitation to gaze excursion or nystagmus noted. Normal smooth pursuit is noted. Hearing is grossly intact. Face is symmetric with normal facial animation and normal facial sensation. Speech is clear with no dysarthria noted. There is no hypophonia. There is no lip, neck/head, jaw or voice tremor. Neck is supple with full range of passive and active motion. There are no carotid bruits on auscultation. Oropharynx exam reveals: mild mouth dryness, adequate dental hygiene and moderate airway crowding, due to smaller airway entry, tonsils 1+ b/l. Mallampati is class II. Tongue protrudes centrally and palate elevates symmetrically. Neck size is 16 5/8 inches.   Chest: Clear to auscultation without wheezing, rhonchi or crackles noted.  Heart: S1+S2+0, regular and normal without murmurs, rubs or gallops noted.   Abdomen: Soft, non-tender and non-distended with normal bowel sounds appreciated on auscultation.  Extremities: There is 1-2+ pitting edema in the distal lower extremities bilaterally. Compression stockings to knees.  Skin: Warm and dry without trophic changes noted.   Musculoskeletal: exam reveals no obvious joint deformities, tenderness or joint swelling or erythema.   Neurologically:  Mental status: The patient is awake, alert and oriented in all 4 spheres. His immediate and remote memory, attention, language skills and fund of knowledge are appropriate. There is no evidence of aphasia, agnosia, apraxia or  anomia. Speech is clear with normal prosody and enunciation. Thought process is linear. Mood is normal and affect is normal.  Cranial nerves II - XII are as described above under HEENT exam. In addition: shoulder shrug is normal with equal shoulder height noted. Motor exam: Normal bulk, strength and tone is noted. There is no drift, tremor or rebound. Fine motor skills and coordination: grossly intact.  Cerebellar testing: No dysmetria or intention tremor. There is no truncal or gait ataxia.  Sensory exam: intact to light touch in the upper and lower extremities.  Gait, station and balance: He stands easily. No veering to one side is noted. No leaning to one side is noted. Posture is age-appropriate and stance is narrow based. Gait shows normal stride length and normal pace.   Assessment and Plan:   In summary, KHASIR WOODROME is a very pleasant 57 y.o.-year old male with an underlying medical history of hypertension and obesity, who presents for evaluation of his obstructive sleep apnea with a prior diagnosis some 5 or 6 years ago. He has not been using CPAP lately. He has previously benefited from CPAP therapy but has not used his machine for the past 2 or 3 years. He has been working on weight loss and reports that he lost quite a bit of weight since his original sleep apnea diagnosis. He would be willing to get retested and consider CPAP therapy again.  I had a long chat with the patient about my findings and the diagnosis of OSA, its prognosis and treatment options. We talked about medical treatments, surgical interventions and non-pharmacological approaches. I explained in particular the risks and ramifications of untreated moderate to severe OSA, especially with respect to developing cardiovascular disease down the Road, including congestive heart failure, difficult to treat hypertension, cardiac arrhythmias, or stroke. Even type 2 diabetes has, in part, been linked to untreated OSA. Symptoms of  untreated OSA include daytime sleepiness, memory problems, mood irritability and mood disorder such as depression and anxiety, lack of energy, as well as recurrent headaches, especially morning headaches. We talked about trying to maintain a healthy lifestyle in general, as well as the importance of weight control. I encouraged the  patient to eat healthy, exercise daily and keep well hydrated, to keep a scheduled bedtime and wake time routine, to not skip any meals and eat healthy snacks in between meals. I advised the patient not to drive when feeling sleepy. I recommended the following at this time: sleep study with potential positive airway pressure titration. (We will score hypopneas at 3%).   I explained the sleep test procedure to the patient and also outlined possible surgical and non-surgical treatment options of OSA, including the use of a custom-made dental device (which would require a referral to a specialist dentist or oral surgeon), upper airway surgical options, such as pillar implants, radiofrequency surgery, tongue base surgery, and UPPP (which would involve a referral to an ENT surgeon). Rarely, jaw surgery such as mandibular advancement may be considered.  I also explained the CPAP treatment option to the patient, who indicated that he would be willing to try CPAP if the need arises. I explained the importance of being compliant with PAP treatment, not only for insurance purposes but primarily to improve His symptoms, and for the patient's long term health benefit, including to reduce His cardiovascular risks. I answered all his questions today and the patient was in agreement. I plan to see him back after the sleep study is completed and encouraged him to call with any interim questions, concerns, problems or updates.   Thank you very much for allowing me to participate in the care of this nice patient. If I can be of any further assistance to you please do not hesitate to call me at  [phone].  Sincerely,   Huston Foley, MD, PhD

## 2018-05-16 NOTE — Patient Instructions (Addendum)

## 2018-05-22 ENCOUNTER — Ambulatory Visit (INDEPENDENT_AMBULATORY_CARE_PROVIDER_SITE_OTHER): Payer: BLUE CROSS/BLUE SHIELD | Admitting: Neurology

## 2018-05-22 DIAGNOSIS — G4720 Circadian rhythm sleep disorder, unspecified type: Secondary | ICD-10-CM

## 2018-05-22 DIAGNOSIS — G4733 Obstructive sleep apnea (adult) (pediatric): Secondary | ICD-10-CM | POA: Diagnosis not present

## 2018-05-22 DIAGNOSIS — R600 Localized edema: Secondary | ICD-10-CM

## 2018-05-22 DIAGNOSIS — E669 Obesity, unspecified: Secondary | ICD-10-CM

## 2018-05-22 DIAGNOSIS — G4719 Other hypersomnia: Secondary | ICD-10-CM

## 2018-05-26 ENCOUNTER — Telehealth: Payer: Self-pay

## 2018-05-26 NOTE — Progress Notes (Signed)
Patient referred by Theora Gianottihelle Jeffrey, PA, seen by me on 05/16/18, diagnostic PSG on 05/22/18.   Please call and notify the patient that the recent sleep study showed mild to severe (in supine sleep) obstructive sleep apnea. I recommend treatment for this in the form of CPAP. This will, ideally, require a repeat sleep study for proper titration and mask fitting and correct monitoring of the oxygen saturations. Please explain to patient. I have placed an order in the chart. Thanks.  Huston FoleySaima Ayla Dunigan, MD, PhD [facility]

## 2018-05-26 NOTE — Telephone Encounter (Signed)
I called pt to discuss his sleep study results. No answer, left a message asking him to call me back. 

## 2018-05-26 NOTE — Telephone Encounter (Signed)
-----   Message from Huston FoleySaima Athar, MD sent at 05/26/2018  8:17 AM EDT ----- Patient referred by Theora Gianottihelle Jeffrey, PA, seen by me on 05/16/18, diagnostic PSG on 05/22/18.   Please call and notify the patient that the recent sleep study showed mild to severe (in supine sleep) obstructive sleep apnea. I recommend treatment for this in the form of CPAP. This will, ideally, require a repeat sleep study for proper titration and mask fitting and correct monitoring of the oxygen saturations. Please explain to patient. I have placed an order in the chart. Thanks.  Huston FoleySaima Athar, MD, PhD [facility]

## 2018-05-26 NOTE — Addendum Note (Signed)
Addended by: Huston FoleyATHAR, Audley Hinojos on: 05/26/2018 08:17 AM   Modules accepted: Orders

## 2018-05-26 NOTE — Procedures (Signed)
PATIENT'S NAME:  Storm FriskWilliams, Marlen DOB:      Jan 23, 1961      MR#:    [id number]     DATE OF RECORDING: 05/22/2018 REFERRING M.D.:  Theora GianottiJeffrey Chelle, PA-C Study Performed:   Baseline Polysomnogram HISTORY: 57 year old man with a history of hypertension and obesity, who was previously diagnosed with obstructive sleep apnea and placed on CPAP therapy. He has not used his CPAP machine for the past 2-3 years. He needs re-evaluation and new equipment. The patient endorsed the Epworth Sleepiness Scale at 12/24 points. The patient's weight 267 pounds with a height of 73 (inches), resulting in a BMI of 35.4 kg/m2. The patient's neck circumference measured 16.5 inches.  CURRENT MEDICATIONS: Amlodipine, Lisinopril, Omeprazole and Sucralfate.   PROCEDURE:  This is a multichannel digital polysomnogram utilizing the Somnostar 11.2 system.  Electrodes and sensors were applied and monitored per AASM Specifications.   EEG, EOG, Chin and Limb EMG, were sampled at 200 Hz.  ECG, Snore and Nasal Pressure, Thermal Airflow, Respiratory Effort, CPAP Flow and Pressure, Oximetry was sampled at 50 Hz. Digital video and audio were recorded.      BASELINE STUDY  Lights Out was at 20:56 and Lights On at 04:18.  Total recording time (TRT) was 443 minutes, with a total sleep time (TST) of  246.5 minutes.   The patient's sleep latency was 26.5 minutes.  REM latency was 46 minutes. The sleep efficiency was 55.6%, which is markedly reduced.     SLEEP ARCHITECTURE: WASO (Wake after sleep onset) was 162.5 minutes with one long period of wakefulness and otherwise mild sleep fragmentation noted.  There were 13.5 minutes in Stage N1, 201 minutes Stage N2, 0 minutes Stage N3 and 32 minutes in Stage REM.  The percentage of Stage N1 was 5.5%, Stage N2 was 81.5%, which is markedly increased, Stage N3 was absent and Stage R (REM sleep) was 13.%, which is reduced. The arousals were noted as: 22 were spontaneous, 0 were associated with PLMs, 51 were  associated with respiratory events.  RESPIRATORY ANALYSIS:  There were a total of 54 respiratory events:  27 obstructive apneas, 0 central apneas and 0 mixed apneas with a total of 27 apneas and an apnea index (AI) of 6.6 /hour. There were 27 hypopneas with a hypopnea index of 6.6 /hour. The patient also had 0 respiratory event related arousals (RERAs).      The total APNEA/HYPOPNEA INDEX (AHI) was 13.1/hour and the total RESPIRATORY DISTURBANCE INDEX was 0. 13.1 /hour.  4 events occurred in REM sleep and 46 events in NREM. The REM AHI was 7.5 /hour, versus a non-REM AHI of 14.. The patient spent 30 minutes of total sleep time in the supine position and 217 minutes in non-supine.. The supine AHI was 54.0 versus a non-supine AHI of 7.5.  OXYGEN SATURATION & C02:  The Wake baseline 02 saturation was 97%, with the lowest being 87%. Time spent below 89% saturation equaled 3 minutes.  PERIODIC LIMB MOVEMENTS: The patient had a total of 0 Periodic Limb Movements.  The Periodic Limb Movement (PLM) index was 0 and the PLM Arousal index was 0/hour.  Audio and video analysis did not show any abnormal or unusual movements, behaviors, phonations or vocalizations. The patient took 2 bathroom breaks. Moderate snoring was noted. The EKG was in keeping with normal sinus rhythm (NSR).  Post-study, the patient indicated that sleep was better than usual.   IMPRESSION:   1. Obstructive Sleep Apnea (OSA) 2. Dysfunctions associated with  sleep stages or arousals from sleep    RECOMMENDATIONS:   1. This study demonstrates overall mild obstructive sleep apnea, severe during supine sleep with a total AHI of 13.1/hour, REM AHI of 7.5/hour, supine AHI of 54/hour and O2 nadir of 87%. Given the patient's medical history and sleep related complaints, treatment with positive airway pressure is recommended; a full-night CPAP titration study is recommended to optimize therapy. Other treatment options may include avoidance of  supine sleep position along with weight loss, upper airway or jaw surgery in selected patients or the use of an oral appliance in certain patients. ENT evaluation and/or consultation with a maxillofacial surgeon or dentist may be feasible in some instances.    2. Please note that untreated obstructive sleep apnea may carry additional perioperative morbidity. Patients with significant obstructive sleep apnea should receive perioperative PAP therapy and the surgeons and particularly the anesthesiologist should be informed of the diagnosis and the severity of the sleep disordered breathing. 3. This study shows abnormal sleep stage percentages and sleep fragmentation; these are nonspecific findings and per se do not signify an intrinsic sleep disorder or a cause for the patient's sleep-related symptoms. Causes include (but are not limited to) the first night effect of the sleep study, circadian rhythm disturbances, medication effect or an underlying mood disorder or medical problem.  4. The patient should be cautioned not to drive, work at heights, or operate dangerous or heavy equipment when tired or sleepy. Review and reiteration of good sleep hygiene measures should be pursued with any patient. 5. The patient will be seen in follow-up in the sleep clinic at [facility] for discussion of the test results, symptom and treatment compliance review, further management strategies, etc. The referring provider will be notified of the test results.   I certify that I have reviewed the entire raw data recording prior to the issuance of this report in accordance with the Standards of Accreditation of the American Academy of Sleep Medicine (AASM)       Huston Foley, MD, PhD Diplomat, American Board of Neurology and Sleep Medicine (Neurology and Sleep Medicine)

## 2018-05-27 NOTE — Telephone Encounter (Signed)
I called pt again to discuss. No answer, left a message asking him to call me back. 

## 2018-05-27 NOTE — Telephone Encounter (Signed)
Pt has returned the call to RN Baxter HireKristen, he is asking for a call back on his mobile #

## 2018-05-27 NOTE — Telephone Encounter (Signed)
I called pt to discuss his sleep study results. No answer, left a message asking him to call me back. 

## 2018-05-30 NOTE — Telephone Encounter (Signed)
Pt returned my call. I advised pt that Dr. Frances FurbishAthar reviewed their sleep study results and found that pt has mild to severe (in supine sleep) osa and recommends that pt be treated with a cpap. Dr. Frances FurbishAthar recommends that pt return for a repeat sleep study in order to properly titrate the cpap and ensure a good mask fit. Pt is agreeable to returning for a titration study. I advised pt that our sleep lab will file with pt's insurance and call pt to schedule the sleep study when we hear back from the pt's insurance regarding coverage of this sleep study. Pt verbalized understanding of results. Pt had no questions at this time but was encouraged to call back if questions arise.

## 2018-06-12 ENCOUNTER — Ambulatory Visit (INDEPENDENT_AMBULATORY_CARE_PROVIDER_SITE_OTHER): Payer: BLUE CROSS/BLUE SHIELD | Admitting: Neurology

## 2018-06-12 DIAGNOSIS — G4733 Obstructive sleep apnea (adult) (pediatric): Secondary | ICD-10-CM

## 2018-06-12 DIAGNOSIS — E669 Obesity, unspecified: Secondary | ICD-10-CM

## 2018-06-12 DIAGNOSIS — G4720 Circadian rhythm sleep disorder, unspecified type: Secondary | ICD-10-CM

## 2018-06-12 DIAGNOSIS — R600 Localized edema: Secondary | ICD-10-CM

## 2018-06-12 DIAGNOSIS — G4719 Other hypersomnia: Secondary | ICD-10-CM

## 2018-06-15 ENCOUNTER — Telehealth: Payer: Self-pay

## 2018-06-15 NOTE — Telephone Encounter (Signed)
-----   Message from Huston FoleySaima Athar, MD sent at 06/15/2018  7:56 AM EDT ----- Patient referred by Kristopher Gianottihelle Jeffrey, PA, seen by me on 05/16/18, diagnostic PSG on 05/22/18. Patient had a CPAP titration study on 06/12/18. Please call and inform patient that I have entered an order for treatment with positive airway pressure (PAP) treatment for obstructive sleep apnea (OSA). He did well during the latest sleep study with CPAP. We will, therefore, arrange for a machine for home use through a DME (durable medical equipment) company of His choice; and I will see the patient back in follow-up in about 10 weeks. Please also explain to the patient that I will be looking out for compliance data, which can be downloaded from the machine (stored on an SD card, that is inserted in the machine) or via remote access through a modem, that is built into the machine. At the time of the followup appointment we will discuss sleep study results and how it is going with PAP treatment at home. Please advise patient to bring His machine at the time of the first FU visit, even though this is cumbersome. Bringing the machine for every visit after that will likely not be needed, but often helps for the first visit to troubleshoot if needed. Please re-enforce the importance of compliance with treatment and the need for us to monitor compliance data - often an insurance requirement and actually good feedback for the patient as far as how they are doing.  Also remind patient, that any interim PAP machine or mask issues should be first addressed with the DME company, as they can often help better with technical and mask fit issues. Please ask if patient has a preference regarding DME company.  Please also make sure, the patient has a follow-up appointment with me in about 10 weeks from the setup date, thanks. May see one of our nurse practitioners if needed for proper timing of the FU appointment.  Please fax or rout report to the referring provider.  Thanks,   Huston FoleySaima Athar, MD, PhD [facility])

## 2018-06-15 NOTE — Addendum Note (Signed)
Addended by: Huston FoleyATHAR, Delmy Holdren on: 06/15/2018 07:56 AM   Modules accepted: Orders

## 2018-06-15 NOTE — Procedures (Signed)
PATIENT'S NAME:  Kristopher Garcia, Kristopher Garcia DOB:      [birth year]      MR#:    [id number]     DATE OF RECORDING: 06/12/2018 REFERRING M.D.:  Theora GianottiJeffrey Chelle, PA-C Study Performed:   CPAP  Titration HISTORY: 57 year old man with a history of hypertension and obesity, who presents for a PAP titration study. Patient had baseline PSG on 05/22/18, which showed a total AHI of 13.1/hour, REM AHI of 7.5/hour, supine AHI of 54/hour and O2 nadir of 87%. The patient's weight 267 pounds with a height of 73 (inches), resulting in a BMI of 35.4 kg/m2. The patient's neck circumference measured 16 inches.  CURRENT MEDICATIONS: Amlodipine, Lisinopril, Omeprazole and Sucralfate.  PROCEDURE:  This is a multichannel digital polysomnogram utilizing the SomnoStar 11.2 system.  Electrodes and sensors were applied and monitored per AASM Specifications.   EEG, EOG, Chin and Limb EMG, were sampled at 200 Hz.  ECG, Snore and Nasal Pressure, Thermal Airflow, Respiratory Effort, CPAP Flow and Pressure, Oximetry was sampled at 50 Hz. Digital video and audio were recorded.      The patient was fitted with a [employer] Air Fit P10 (Large). CPAP was initiated at 5 cmH20 with heated humidity per AASM split night standards and pressure was advanced to 8 cmH20 because of hypopneas, apneas and desaturations.  At a PAP pressure of 8 cmH20, there was a reduction of the AHI to 0/hour with supine REM sleep achieved and O2 nadir of 93%  Lights Out was at 20:44 and Lights On at 04:21. Total recording time (TRT) was 457.5 minutes, with a total sleep time (TST) of 302.5 minutes. The patient's sleep latency to persistent sleep was 32.5 minutes. REM latency was 95 minutes.  The sleep efficiency was 66.1%, which is reduced.    SLEEP ARCHITECTURE: WASO (Wake after sleep onset) was 54.5 minutes, which is increased, with mild to moderate sleep fragmentation noted. There were 18.5 minutes in Stage N1, 218 minutes Stage N2, 0 minutes Stage N3 and 66 minutes in Stage REM.   The percentage of Stage N1 was 6.1%, Stage N2 was 72.1%, whch is increased, Stage N3 was absent, and Stage R (REM sleep) was 21.8%, which is normal. The arousals were noted as: 32 were spontaneous, 0 were associated with PLMs, 3 were associated with respiratory events.  RESPIRATORY ANALYSIS:  There was a total of 3 respiratory events: 0 obstructive apneas, 0 central apneas and 0 mixed apneas with a total of 0 apneas and an apnea index (AI) of 0 /hour. There were 3 hypopneas with a hypopnea index of .6/hour. The patient also had 0 respiratory event related arousals (RERAs).      The total APNEA/HYPOPNEA INDEX  (AHI) was 0.6/hour and the total RESPIRATORY DISTURBANCE INDEX was 0.6/hour  0 events occurred in REM sleep and 3 events in NREM. The REM AHI was 0 /hour versus a non-REM AHI of 0.8/hour.  The patient spent 147.5 minutes of total sleep time in the supine position and 155 minutes in non-supine. The supine AHI was 0/hour, versus a non-supine AHI of 1.2/hour.  OXYGEN SATURATION & C02:  The baseline 02 saturation was 96%, with the lowest being 81%. Time spent below 89% saturation equaled 0 minutes.  PERIODIC LIMB MOVEMENTS:  The patient had a total of 0 Periodic Limb Movements. The Periodic Limb Movement (PLM) index was 0 and the PLM Arousal index was 0 /hour.  Audio and video analysis did not show any abnormal or unusual movements, behaviors, phonations or  vocalizations. The patient took bathroom breaks. The EKG was in keeping with normal sinus rhythm (NSR).  Post-study, the patient indicated that sleep was better than usual.   IMPRESSION:   1. Obstructive Sleep Apnea (OSA) 2. Dysfunctions associated with sleep stages or arousals from sleep    RECOMMENDATIONS:   1. This study demonstrates resolution of the patient's obstructive sleep apnea with CPAP therapy. I will, therefore, start the patient on home CPAP treatment at a pressure of 8 cm via large nasal pillows with heated humidity. The  patient should be reminded to be fully compliant with PAP therapy to improve sleep related symptoms and decrease long term cardiovascular risks. The patient should be reminded, that it may take up to 3 months to get fully used to using PAP with all planned sleep. The earlier full compliance is achieved, the better long term compliance tends to be. Please note that untreated obstructive sleep apnea may carry additional perioperative morbidity. Patients with significant obstructive sleep apnea should receive perioperative PAP therapy and the surgeons and particularly the anesthesiologist should be informed of the diagnosis and the severity of the sleep disordered breathing. 2. This study shows abnormal sleep stage percentages and sleep fragmentation; these are nonspecific findings and per se do not signify an intrinsic sleep disorder or a cause for the patient's sleep-related symptoms. Causes include (but are not limited to) the first night effect of the sleep study, circadian rhythm disturbances, medication effect or an underlying mood disorder or medical problem.  3. The patient should be cautioned not to drive, work at heights, or operate dangerous or heavy equipment when tired or sleepy. Review and reiteration of good sleep hygiene measures should be pursued with any patient. 4. The patient will be seen in follow-up in the [facility] at [facility] for discussion of the test results, symptom and treatment compliance review, further management strategies, etc. The referring provider will be notified of the test results.   I certify that I have reviewed the entire raw data recording prior to the issuance of this report in accordance with the Standards of Accreditation of the American Academy of Sleep Medicine (AASM)       Huston FoleySaima Sweta Halseth, MD, PhD Diplomat, American Board of Neurology and Sleep Medicine (Neurology and Sleep Medicine)

## 2018-06-15 NOTE — Progress Notes (Signed)
Patient referred by Theora Gianottihelle Jeffrey, PA, seen by me on 05/16/18, diagnostic PSG on 05/22/18. Patient had a CPAP titration study on 06/12/18. Please call and inform patient that I have entered an order for treatment with positive airway pressure (PAP) treatment for obstructive sleep apnea (OSA). He did well during the latest sleep study with CPAP. We will, therefore, arrange for a machine for home use through a DME (durable medical equipment) company of His choice; and I will see the patient back in follow-up in about 10 weeks. Please also explain to the patient that I will be looking out for compliance data, which can be downloaded from the machine (stored on an SD card, that is inserted in the machine) or via remote access through a modem, that is built into the machine. At the time of the followup appointment we will discuss sleep study results and how it is going with PAP treatment at home. Please advise patient to bring His machine at the time of the first FU visit, even though this is cumbersome. Bringing the machine for every visit after that will likely not be needed, but often helps for the first visit to troubleshoot if needed. Please re-enforce the importance of compliance with treatment and the need for us to monitor compliance data - often an insurance requirement and actually good feedback for the patient as far as how they are doing.  Also remind patient, that any interim PAP machine or mask issues should be first addressed with the DME company, as they can often help better with technical and mask fit issues. Please ask if patient has a preference regarding DME company.  Please also make sure, the patient has a follow-up appointment with me in about 10 weeks from the setup date, thanks. May see one of our nurse practitioners if needed for proper timing of the FU appointment.  Please fax or rout report to the referring provider. Thanks,   Huston FoleySaima Alexsia Klindt, MD, PhD [facility])

## 2018-06-15 NOTE — Telephone Encounter (Signed)
I called pt to discuss his sleep study results. No answer, left a message asking him to call me back. 

## 2018-06-16 NOTE — Telephone Encounter (Signed)
I called pt. I advised pt that Dr. Frances FurbishAthar reviewed their sleep study results and found that pt did well with the cpap during his latest sleep study. Dr. Frances FurbishAthar recommends that pt start a cpap at home. I reviewed PAP compliance expectations with the pt. Pt is agreeable to starting a CPAP. I advised pt that an order will be sent to a DME, [facility], and [employer] will call the pt within about one week after they file with the pt's insurance. [facility] will show the pt how to use the machine, fit for masks, and troubleshoot the CPAP if needed. A follow up appt was made for insurance purposes with Dr. Frances FurbishAthar on 09/08/18 at 2:00pm. Pt verbalized understanding to arrive 15 minutes early and bring their CPAP. A letter with all of this information in it will be sent to the pt's mychart account as a reminder. I verified with the pt that the address we have on file is correct. Pt verbalized understanding of results. Pt had no questions at this time but was encouraged to call back if questions arise.

## 2018-08-03 ENCOUNTER — Emergency Department (HOSPITAL_COMMUNITY)
Admission: EM | Admit: 2018-08-03 | Discharge: 2018-08-03 | Disposition: A | Discharge status: Home / Self Care | Payer: BLUE CROSS/BLUE SHIELD | Attending: Emergency Medicine | Admitting: Emergency Medicine

## 2018-08-03 ENCOUNTER — Emergency Department (HOSPITAL_COMMUNITY): Payer: BLUE CROSS/BLUE SHIELD

## 2018-08-03 ENCOUNTER — Encounter (HOSPITAL_COMMUNITY): Payer: Self-pay

## 2018-08-03 DIAGNOSIS — M25512 Pain in left shoulder: Secondary | ICD-10-CM | POA: Insufficient documentation

## 2018-08-03 DIAGNOSIS — I10 Essential (primary) hypertension: Secondary | ICD-10-CM | POA: Insufficient documentation

## 2018-08-03 DIAGNOSIS — Z79899 Other long term (current) drug therapy: Secondary | ICD-10-CM | POA: Insufficient documentation

## 2018-08-03 MED ORDER — KETOROLAC TROMETHAMINE 30 MG/ML IJ SOLN
30.0000 mg | Freq: Once | INTRAMUSCULAR | Status: AC
Start: 2018-08-03 — End: 2018-08-03
  Administered 2018-08-03: 30 mg via INTRAMUSCULAR
  Filled 2018-08-03: qty 1

## 2018-08-03 MED ORDER — IBUPROFEN 400 MG PO TABS
600.0000 mg | ORAL_TABLET | Freq: Once | ORAL | Status: DC
  Filled 2018-08-03: qty 1

## 2018-08-03 MED ORDER — NAPROXEN 500 MG PO TABS: 500.0000 mg | ORAL_TABLET | Freq: Two times a day (BID) | ORAL | 0 refills | Status: DC

## 2018-08-03 NOTE — ED Provider Notes (Signed)
[facility] EMERGENCY DEPARTMENT Provider Note   CSN: [id number] Arrival date & time: 08/03/18  0607   History   Chief Complaint Shoulder pain  HPI Kristopher Garcia is a 57 y.o. male with a history of HTN and dyslipidemia who presents for evaluation of shoulder pain x 2 days. Patient states that his pain is located into the posterior portion of his left shoulder. Pain is worse with movement such as over head activity and abduction. Admits to repetitive motion at work. States pain is increased when he lays on his left shoulder.  States he did bump his shoulder on a machine at work 3 days ago,however did not have pain at that time. Denies fever, chills, nausea, vomiting, chest pain, SOB, neck pain, neck stiffness, midline back pain, weakness, numbness, tingling.  She states he has been taking Tylenol with mild relief of his symptoms.  Patient states his pain is decreased when he does not move the arm.  HPI  Past Medical History:  Diagnosis Date  . Allergy   . Dyslipidemia   . Hypertension   . OA (osteoarthritis)   . Obesity   . Obstructive sleep apnea   . Varicose veins of lower extremities with ulcer (HCC) 09/03/2014    Patient Active Problem List   Diagnosis Date Noted  . Reflux esophagitis 03/18/2018  . Hiatal hernia 03/18/2018  . Varicose veins of leg with swelling, bilateral 10/08/2017  . OSA (obstructive sleep apnea) 08/02/2014  . Essential hypertension 05/04/2014  . BMI 35.0-35.9,adult 05/04/2014    Past Surgical History:  Procedure Laterality Date  . CARDIAC CATHETERIZATION  06/21/12  . LEFT HEART CATHETERIZATION WITH CORONARY ANGIOGRAM N/A 06/21/2012   Procedure: LEFT HEART CATHETERIZATION WITH CORONARY ANGIOGRAM;  Surgeon: Donato Schultz, MD;  Location: [facility] CATH LAB;  Service: Cardiovascular;  Laterality: N/A;  . NECK SURGERY    . ROTATOR CUFF REPAIR Right   . SPINE SURGERY          Home Medications    Prior to Admission medications   Medication  Sig Start Date End Date Taking? Authorizing Provider  amLODipine (NORVASC) 10 MG tablet Take 1 tablet (10 mg total) by mouth daily. 10/08/17   Porfirio Oar, PA  lisinopril (PRINIVIL,ZESTRIL) 10 MG tablet Take 1 tablet (10 mg total) by mouth daily. 02/11/18   Porfirio Oar, PA  lisinopril-hydrochlorothiazide (PRINZIDE,ZESTORETIC) 20-25 MG tablet Take 1 tablet by mouth daily. 02/11/18   Porfirio Oar, PA  naproxen (NAPROSYN) 500 MG tablet Take 1 tablet (500 mg total) by mouth 2 (two) times daily. 08/03/18   Collan Schoenfeld A, PA-C  omeprazole (PRILOSEC) 40 MG capsule Take 40 mg by mouth daily. 03/09/18   [provider]  sucralfate (CARAFATE) 1 g tablet TAKE 1 TABLET BY MOUTH 4 TIMES A DAY (BETWEEN MEALS AND AT BEDTIME) 03/09/18   [provider]    Family History Family History  Problem Relation Age of Onset  . Cancer Mother 50       lung cancer  . Diabetes Mother   . Stroke Mother   . Heart disease Father   . Mental illness Brother        schizophrenia    Social History Social History   Tobacco Use  . Smoking status: Never Smoker  . Smokeless tobacco: Never Used  Substance Use Topics  . Alcohol use: No    Alcohol/week: 0.0 standard drinks  . Drug use: No     Allergies   Bee venom  Review of Systems Review of Systems  Constitutional: Negative for activity change, appetite change, chills, diaphoresis, fatigue, fever and unexpected weight change.  Respiratory: Negative for cough, chest tightness, shortness of breath, wheezing and stridor.   Cardiovascular: Negative for chest pain.  Gastrointestinal: Negative for abdominal distention, abdominal pain, nausea and vomiting.  Musculoskeletal: Negative for arthralgias, back pain, neck pain and neck stiffness.  Skin: Negative.   Neurological: Negative for dizziness, weakness, light-headedness and numbness.     Physical Exam Updated Vital Signs BP (!) 167/100 (BP Location: Right Arm)   Pulse 81   Temp  98.1 F (36.7 C) (Oral)   Resp 16   Ht 6\' 1"  (1.854 m)   Wt 117.9 kg   SpO2 98%   BMI 34.30 kg/m   Physical Exam  Constitutional: He appears well-developed and well-nourished. No distress.  HENT:  Head: Atraumatic.  Eyes: Pupils are equal, round, and reactive to light.  Neck: Normal range of motion. Neck supple. No spinous process tenderness and no muscular tenderness present. No neck rigidity. Normal range of motion present.  Cardiovascular: Normal rate, regular rhythm, normal heart sounds, intact distal pulses and normal pulses.  No murmur heard. Pulmonary/Chest: Effort normal and breath sounds normal. No stridor. No respiratory distress. He has no decreased breath sounds. He has no wheezes. He has no rales. He exhibits no tenderness and no crepitus.  Abdominal: Soft. He exhibits no distension.  Musculoskeletal: Normal range of motion.       Left shoulder: He exhibits tenderness. He exhibits no swelling, no effusion, no crepitus, no deformity, no laceration, no spasm, normal pulse and normal strength.  Tenderness to palpation of the posterior left shoulder. Negative empty can. Positive Hawkins. No anterior tenderness to palpation. 5/5 strength to bilateral upper extremity. Full grip strength. Full ROM with adduction, abduction, however moderate pain with abduction over 45 degrees.. No midline tenderness to neck or back.   Neurological: He is alert. He has normal strength.  Intact sensation to sharp and dull bilateral upper extremities.   Skin: Skin is warm and dry. He is not diaphoretic.  Psychiatric: He has a normal mood and affect.  Nursing note and vitals reviewed.    ED Treatments / Results  Labs (all labs ordered are listed, but only abnormal results are displayed) Labs Reviewed - No data to display  EKG None  Radiology Dg Shoulder Left  Result Date: 08/03/2018 CLINICAL DATA:  Severe shoulder pain over the last several days. No acute injury. History of cervical fusion.  EXAM: LEFT SHOULDER - 2+ VIEW COMPARISON:  None. FINDINGS: The mineralization and alignment are normal. There is no evidence of acute fracture or dislocation. The subacromial space appears adequately preserved. There are mild acromioclavicular and glenohumeral degenerative changes. There is mild spurring of the greater tuberosity. Postsurgical changes are noted in the cervical spine consistent with previous corpectomy and fusion. IMPRESSION: No acute left shoulder findings. Mild degenerative changes as described. Electronically Signed   By: Carey Bullocks M.D.   On: 08/03/2018 08:12    Procedures Procedures (including critical care time)  Medications Ordered in ED Medications  ketorolac (TORADOL) 30 MG/ML injection 30 mg (30 mg Intramuscular Given 08/03/18 0749)     Initial Impression / Assessment and Plan / ED Course  I have reviewed the triage vital signs and the nursing notes as well as past medical history.  Pertinent labs & imaging results that were available during my care of the patient were reviewed by me and considered  in my medical decision making (see chart for details).  57 year old otherwise healthy appearing male presents for evaluation of 2 days of left shoulder pain.  Full range of motion, 5/5 strength bilateral upper extremities.  Pain worse with overhead movement on his left shoulder. Admits to repetitive overhead movements at work.  No chest pain, shortness of breath, pleuritic pain, midline or neck back pain.  Will obtain plain film to rule out fracture dislocation, however I feel this is more musculoskeletal nature.  Patient requesting pain medicine however he does not have a ride home and states he is sensitive to pain medicine.  Will try Toradol.  Reevaluation patient with decrease in pain with Toradol.  Discussed with patient that his pain is most likely musculoskeletal in nature.  Suspicion for neck or back pathology causing his shoulder pain as he has no neck or back  tenderness or numbness or numbness or tingling in his extremities.  Plan film negative for fracture dislocation.  Will write work note as he does repetitive motions.  Will trial naproxen, ice and rest.  Discussed with patient return precautions.  Patient to follow-up with orthopedics if his pain does not resolve.  Patient with elevated blood pressure in the ED.  Admits to history of hypertension.  Feel his blood pressure is elevated secondary to pain.  Denies chest pain, shortness of breath, headache, blurred vision.  Discussed with patient reasons to return to the emergency room.  Patient voiced understanding.    Final Clinical Impressions(s) / ED Diagnoses   Final diagnoses:  Acute pain of left shoulder    ED Discharge Orders         Ordered    naproxen (NAPROSYN) 500 MG tablet  2 times daily     08/03/18 0841           Elijan Googe A, PA-C 08/03/18 1052    Little, Ambrose Finland, MD 08/03/18 1218

## 2018-08-03 NOTE — Discharge Instructions (Addendum)
You were evaluated today for shoulder pain.  You xray was negative for fracture or dislocation. Your pain is most likely musculoskeletal in nature. I would recommend naproxen, ice and rest for your shoulder. Please Follow up with [facility] for re-evaluation if your pain does not resolve.  Please follow-up with your primary care provider as her blood pressure was elevated during today's visit.  Please return to the ED if any new or worsening symptoms such as:  Contact a doctor if: Your pain gets worse. Medicine does not help your pain. You have new pain in your arm, hand, or fingers. Get help right away if: Your arm, hand, or fingers: Tingle. Are numb. Are swollen. Are painful. Turn white or blue.

## 2018-08-03 NOTE — ED Triage Notes (Signed)
Pt. From home with reports of left shoulder pain that started two days ago that is getting worse. Pt. Reports increase in pain when pt. Bends or twists. Pt. Tried to go to PCP but unable to get treated. Pt. Reports lifting frequently at work. A/O X4.

## 2018-08-03 NOTE — ED Notes (Signed)
Patient verbalizes understanding of discharge instructions. Opportunity for questioning and answers were provided. Pt discharged from ED. 

## 2018-09-06 ENCOUNTER — Encounter: Payer: Self-pay | Admitting: Neurology

## 2018-09-08 ENCOUNTER — Encounter: Payer: Self-pay | Admitting: Neurology

## 2018-09-08 ENCOUNTER — Ambulatory Visit: Payer: BLUE CROSS/BLUE SHIELD | Admitting: Neurology

## 2018-09-08 VITALS — BP 167/103 | HR 81 | Ht 73.0 in | Wt 267.0 lb

## 2018-09-08 DIAGNOSIS — Z9989 Dependence on other enabling machines and devices: Secondary | ICD-10-CM

## 2018-09-08 DIAGNOSIS — G4733 Obstructive sleep apnea (adult) (pediatric): Secondary | ICD-10-CM

## 2018-09-08 NOTE — Progress Notes (Signed)
Subjective:    Patient ID: Kristopher Garcia is a 57 y.o. male.  HPI     Interim history:   Kristopher Garcia is a 57 year old right-handed gentleman with an underlying medical history of hypertension and obesity, who presents for follow-up consultation of his obstructive sleep apnea, after recent sleep study testing and starting CPAP therapy. The patient is unaccompanied today. I first met him on 05/16/2018 at the request of his primary care PA, at which time he reported a prior diagnosis of obstructive sleep apnea but he had not been using CPAP for the past 2-3 years. He was advised to proceed with sleep study testing. He had a baseline sleep study, followed by a CPAP titration study. I went over his test results with him in detail today. Baseline sleep study from 05/22/2018 showed a reduced sleep efficiency of 55.6%, sleep latency was 26.5 minutes and REM latency was 46 minutes. He had an increased percentage of light stage sleep, absence of slow-wave sleep and a reduced percentage of REM sleep. AHI was 13.1 per hour, REM AHI 7.5 per hour and supine AHI was 54 per hour. Average oxygen saturation was 97%, nadir was 87%. He had no significant PLMS. He was advised to proceed with a full night CPAP titration study. He had this on 06/12/2018. Sleep efficiency was 66.1%, sleep latency was 32.5 minutes and REM latency was 95 minutes. He had absence of slow-wave sleep and a normal percentage of REM sleep. He was fitted with nasal pillows and titrated on CPAP from 5 cm to 8 cm. On the final pressure his AHI was 0 per hour with supine REM sleep achieved an O2 nadir of 93%. He had no significant PLMS. Based on his test results I prescribed CPAP therapy for home use at a pressure of 8 cm.  Today, 09/08/2018: I reviewed his CPAP compliance data from 08/08/2018 through 09/06/2018 which is a total of 30 days, during which time he used his CPAP every night with percent used days greater than 4 hours at 97%, indicating  excellent compliance with an average usage of 5 hours and 47 minutes, residual AHI at goal at 2.4 per hour, leak high with the 95th percentile at 33.3 L/m on a pressure of 8 cm. He reports doing okay with his current/new machine. He has adapted eventually to treatment. He feels like it's going well. His supplies may need to be updated at this point. He has established with a new DME company. He is motivated to continue with treatment. He is working on [employer].  The patient's allergies, current medications, family history, past medical history, past social history, past surgical history and problem list were reviewed and updated as appropriate.   Previously:  05/16/2018: (He) was previously diagnosed with obstructive sleep apnea and placed on CPAP therapy. His Epworth sleepiness score is 12 out of 24, fatigue score is 12 out of 63. Prior sleep study results are not available for my review today. I reviewed your office note from 03/30/2018. He has not used his CPAP machine for the past 2-3 years, he has not received any supplies. Prior DME company was [facility]. A CPAP download was not available from his machine which is a ResMed S9. He recalls that he was on a pressure of 13 cm. The SD card is stuck in his machine. He benefited from using CPAP in the past as he recalls. He has been working on weight loss in the past years. He has lower extremity  swelling for which he uses compression stockings. He is trying to make lifestyle changes to improve his blood pressure and weight. He is married and lives with his wife and son. He has a total of 2 children, he works in Chief Executive Officer at [employer]. He has to be at work at 6 AM and works still 2:30 PM, bedtime generally between 8 and 10 PM and Fridays time around 3:30 AM. He does not always achieve his desired bedtime and is sometimes sleep deprived he admits. He is not aware of any family history of sleep apnea. He did not have a tonsillectomy. He had neck  surgery in 2005 and shoulder surgery in 2013 or thereabouts. He is a nonsmoker and does not typically utilize alcohol except for very occasionally, he drinks caffeine in the form of coffee, 2-3 cups per day on average.  His Past Medical History Is Significant For: Past Medical History:  Diagnosis Date  . Allergy   . Dyslipidemia   . Hypertension   . OA (osteoarthritis)   . Obesity   . Obstructive sleep apnea   . Varicose veins of lower extremities with ulcer ([place]) 09/03/2014    His Past Surgical History Is Significant For: Past Surgical History:  Procedure Laterality Date  . CARDIAC CATHETERIZATION  06/21/12  . LEFT HEART CATHETERIZATION WITH CORONARY ANGIOGRAM N/A 06/21/2012   Procedure: LEFT HEART CATHETERIZATION WITH CORONARY ANGIOGRAM;  Surgeon: Candee Furbish, MD;  Location: [facility] CATH LAB;  Service: Cardiovascular;  Laterality: N/A;  . NECK SURGERY    . ROTATOR CUFF REPAIR Right   . SPINE SURGERY      His Family History Is Significant For: Family History  Problem Relation Age of Onset  . Cancer Mother 31       lung cancer  . Diabetes Mother   . Stroke Mother   . Heart disease Father   . Mental illness Brother        schizophrenia    His Social History Is Significant For: Social History   Socioeconomic History  . Marital status: Married    Spouse name: Kristopher Garcia  . Number of children: 2  . Years of education: 16  . Highest education level: Bachelor's degree (e.g., BA, AB, BS)  Occupational History  . Occupation: Ecologist: [employer]  Social Needs  . Financial resource strain: Not on file  . Food insecurity:    Worry: Not on file    Inability: Not on file  . Transportation needs:    Medical: Not on file    Non-medical: Not on file  Tobacco Use  . Smoking status: Never Smoker  . Smokeless tobacco: Never Used  Substance and Sexual Activity  . Alcohol use: No    Alcohol/week: 0.0 standard drinks  . Drug use: No  . Sexual activity: Yes  Lifestyle  .  Physical activity:    Days per week: Not on file    Minutes per session: Not on file  . Stress: Not on file  Relationships  . Social connections:    Talks on phone: Not on file    Gets together: Not on file    Attends religious service: Not on file    Active member of club or organization: Not on file    Attends meetings of clubs or organizations: Not on file    Relationship status: Not on file  Other Topics Concern  . Not on file  Social History Narrative   Lives with his wife and son.  Daughter lives independently nearby.    His Allergies Are:  Allergies  Allergen Reactions  . Bee Venom Swelling  :   His Current Medications Are:  Outpatient Encounter Medications as of 09/08/2018  Medication Sig  . amLODipine (NORVASC) 10 MG tablet Take 1 tablet (10 mg total) by mouth daily.  Marland Kitchen lisinopril (PRINIVIL,ZESTRIL) 10 MG tablet Take 1 tablet (10 mg total) by mouth daily.  Marland Kitchen lisinopril-hydrochlorothiazide (PRINZIDE,ZESTORETIC) 20-25 MG tablet Take 1 tablet by mouth daily.  Marland Kitchen omeprazole (PRILOSEC) 40 MG capsule Take 40 mg by mouth daily.  . [DISCONTINUED] naproxen (NAPROSYN) 500 MG tablet Take 1 tablet (500 mg total) by mouth 2 (two) times daily.  . [DISCONTINUED] sucralfate (CARAFATE) 1 g tablet TAKE 1 TABLET BY MOUTH 4 TIMES A DAY (BETWEEN MEALS AND AT BEDTIME)   No facility-administered encounter medications on file as of 09/08/2018.   :  Review of Systems:  Out of a complete 14 point review of systems, all are reviewed and negative with the exception of these symptoms as listed below:  Review of Systems  Neurological:       Pt presents today to discuss his cpap. Pt reports that it is going well.    Objective:  Neurological Exam  Physical Exam Physical Examination:   Vitals:   09/08/18 1359  BP: (!) 167/103  Pulse: 81   General Examination: The patient is a very pleasant 57 y.o. male in no acute distress. He appears well-developed and well-nourished and well groomed.    HEENT: Normocephalic, atraumatic, pupils are equal, round and reactive to light and accommodation. Extraocular tracking is good without limitation to gaze excursion or nystagmus noted. Normal smooth pursuit is noted. Hearing is grossly intact. Face is symmetric with normal facial animation and normal facial sensation. Speech is clear with no dysarthria noted. There is no hypophonia. There is no lip, neck/head, jaw or voice tremor. Neck with FROM. Oropharynx exam reveals: mild mouth dryness, adequate dental hygiene and moderate airway crowding. Tongue protrudes centrally and palate elevates symmetrically. No nostril sores.   Chest: Clear to auscultation without wheezing, rhonchi or crackles noted.  Heart: S1+S2+0, regular and normal without murmurs, rubs or gallops noted.   Abdomen: Soft, non-tender and non-distended with normal bowel sounds appreciated on auscultation.  Extremities: There is no edema in the distal lower extremities bilaterally.  Skin: Warm and dry without trophic changes noted.   Musculoskeletal: exam reveals no obvious joint deformities, tenderness or joint swelling or erythema.   Neurologically:  Mental status: The patient is awake, alert and oriented in all 4 spheres. His immediate and remote memory, attention, language skills and fund of knowledge are appropriate. There is no evidence of aphasia, agnosia, apraxia or anomia. Speech is clear with normal prosody and enunciation. Thought process is linear. Mood is normal and affect is normal.  Cranial nerves II - XII are as described above under HEENT exam. In addition: shoulder shrug is normal with equal shoulder height noted. Motor exam: Normal bulk, strength and tone is noted. There is no drift, tremor or rebound. Fine motor skills and coordination: grossly intact.  Cerebellar testing: No dysmetria or intention tremor. There is no truncal or gait ataxia.  Sensory exam: intact to light touch in the upper and lower  extremities.  Gait, station and balance: He stands easily. No veering to one side is noted. No leaning to one side is noted. Posture is age-appropriate and stance is narrow based. Gait shows normal stride length and normal pace.  Assessment and Plan:   In summary, ESHAWN COOR is a very pleasant 57 year old male with an underlying medical history of hypertension and obesity, who presents for follow-up consultation of his obstructive sleep apnea. He carried a prior diagnosis of sleep apnea from about 6 years ago but had not been able to get new supplies and needed new equipment. He had reevaluation with a baseline sleep study in July 2019, followed by a CPAP titration study in August 2019. He has done well with CPAP, he is compliant with treatment and highly commended for this. He has had some weight fluctuations. He feels that his sleep quality is improved. He is motivated to continue with treatment and encouraged to be fully compliant with CPAP. At this juncture he can follow-up in 1 year routinely, sooner if needed. He is advised to follow-up with one of our nurse practitioners. He is encouraged to talk to his DME company about replacement supplies. I reviewed his sleep study results with him in detail today and also reviewed his compliance data. I answered all his questions today and he was in agreement. I spent 25 minutes in total face-to-face time with the patient, more than 50% of which was spent in counseling and coordination of care, reviewing test results, reviewing medication and discussing or reviewing the diagnosis of OSA, its prognosis and treatment options. Pertinent laboratory and imaging test results that were available during this visit with the patient were reviewed by me and considered in my medical decision making (see chart for details).

## 2018-09-08 NOTE — Patient Instructions (Addendum)
Please continue using your CPAP regularly. While your insurance requires that you use CPAP at least 4 hours each night on 70% of the nights, I recommend, that you not skip any nights and use it throughout the night if you can. Getting used to CPAP and staying with the treatment long term does take time and patience and discipline. Untreated obstructive sleep apnea when it is moderate to severe can have an adverse impact on cardiovascular health and raise her risk for heart disease, arrhythmias, hypertension, congestive heart failure, stroke and diabetes. Untreated obstructive sleep apnea causes sleep disruption, nonrestorative sleep, and sleep deprivation. This can have an impact on your day to day functioning and cause daytime sleepiness and impairment of cognitive function, memory loss, mood disturbance, and problems focussing. Using CPAP regularly can improve these symptoms.  You may need new supplies, call [facility].  The address for [facility] is: [address] [phone].  Keep up the good work! We can see you in 1 year, you can see one of our nurse practitioners as you are stable. I will see you after that.

## 2019-09-12 ENCOUNTER — Encounter: Payer: Self-pay | Admitting: Neurology

## 2019-09-14 ENCOUNTER — Other Ambulatory Visit: Payer: Self-pay

## 2019-09-14 ENCOUNTER — Encounter: Payer: Self-pay | Admitting: Adult Health

## 2019-09-14 ENCOUNTER — Ambulatory Visit: Payer: BLUE CROSS/BLUE SHIELD | Admitting: Adult Health

## 2019-09-14 ENCOUNTER — Ambulatory Visit (INDEPENDENT_AMBULATORY_CARE_PROVIDER_SITE_OTHER): Payer: BC Managed Care – PPO | Admitting: Adult Health

## 2019-09-14 VITALS — BP 124/76 | HR 72 | Temp 97.7°F | Ht 73.0 in | Wt 266.4 lb

## 2019-09-14 DIAGNOSIS — G4733 Obstructive sleep apnea (adult) (pediatric): Secondary | ICD-10-CM

## 2019-09-14 DIAGNOSIS — Z9989 Dependence on other enabling machines and devices: Secondary | ICD-10-CM

## 2019-09-14 NOTE — Progress Notes (Addendum)
PATIENT: Kristopher Garcia DOB: Feb 06, 1961  REASON FOR VISIT: follow up HISTORY FROM: patient  HISTORY OF PRESENT ILLNESS: Today 09/14/19:  Kristopher Garcia is a 58 year old male with a history of obstructive sleep apnea on CPAP.  He returns today for follow-up.  His download indicates that he use his machine 29 out of 30 days for compliance of 97%.  He uses machine greater than 4 hours 26 days for compliance of 87%.  On average he uses his machine 5 hours and 43 minutes.  His residual AHI is 3.7 on 8 cm of water with EPR of 1.  His leak in the 95th percentile is 30 L/min.  He reports that he recently changed out his mask and that improved his leak.  He returns today for an evaluation.  HISTORY starting CPAP therapy. The patient is unaccompanied today. I first met him on 05/16/2018 at the request of his primary care PA, at which time he reported a prior diagnosis of obstructive sleep apnea but he had not been using CPAP for the past 2-3 years. He was advised to proceed with sleep study testing. He had a baseline sleep study, followed by a CPAP titration study. I went over his test results with him in detail today. Baseline sleep study from 05/22/2018 showed a reduced sleep efficiency of 55.6%, sleep latency was 26.5 minutes and REM latency was 46 minutes. He had an increased percentage of light stage sleep, absence of slow-wave sleep and a reduced percentage of REM sleep. AHI was 13.1 per hour, REM AHI 7.5 per hour and supine AHI was 54 per hour. Average oxygen saturation was 97%, nadir was 87%. He had no significant PLMS. He was advised to proceed with a full night CPAP titration study. He had this on 06/12/2018. Sleep efficiency was 66.1%, sleep latency was 32.5 minutes and REM latency was 95 minutes. He had absence of slow-wave sleep and a normal percentage of REM sleep. He was fitted with nasal pillows and titrated on CPAP from 5 cm to 8 cm. On the final pressure his AHI was 0 per hour with supine REM  sleep achieved an O2 nadir of 93%. He had no significant PLMS. Based on his test results I prescribed CPAP therapy for home use at a pressure of 8 cm.  Today, 09/08/2018: I reviewed his CPAP compliance data from 08/08/2018 through 09/06/2018 which is a total of 30 days, during which time he used his CPAP every night with percent used days greater than 4 hours at 97%, indicating excellent compliance with an average usage of 5 hours and 47 minutes, residual AHI at goal at 2.4 per hour, leak high with the 95th percentile at 33.3 L/m on a pressure of 8 cm. He reports doing okay with his current/new machine. He has adapted eventually to treatment. He feels like it's going well. His supplies may need to be updated at this point. He has established with a new DME company. He is motivated to continue with treatment. He is working on [employer].   REVIEW OF SYSTEMS: Out of a complete 14 system review of symptoms, the patient complains only of the following symptoms, and all other reviewed systems are negative.  Epworth sleepiness score 1  ALLERGIES: Allergies  Allergen Reactions   Bee Venom Swelling    HOME MEDICATIONS: Outpatient Medications Prior to Visit  Medication Sig Dispense Refill   amLODipine (NORVASC) 10 MG tablet Take 1 tablet (10 mg total) by mouth daily. 90 tablet 3  lisinopril (PRINIVIL,ZESTRIL) 10 MG tablet Take 1 tablet (10 mg total) by mouth daily. 90 tablet 3   omeprazole (PRILOSEC) 40 MG capsule Take 40 mg by mouth daily.  4   lisinopril-hydrochlorothiazide (PRINZIDE,ZESTORETIC) 20-25 MG tablet Take 1 tablet by mouth daily. (Patient not taking: Reported on 09/14/2019) 90 tablet 3   No facility-administered medications prior to visit.     PAST MEDICAL HISTORY: Past Medical History:  Diagnosis Date   Allergy    Dyslipidemia    Hypertension    OA (osteoarthritis)    Obesity    Obstructive sleep apnea    Varicose veins of lower extremities with ulcer ([place])  09/03/2014    PAST SURGICAL HISTORY: Past Surgical History:  Procedure Laterality Date   CARDIAC CATHETERIZATION  06/21/12   LEFT HEART CATHETERIZATION WITH CORONARY ANGIOGRAM N/A 06/21/2012   Procedure: LEFT HEART CATHETERIZATION WITH CORONARY ANGIOGRAM;  Surgeon: Candee Furbish, MD;  Location: [facility] CATH LAB;  Service: Cardiovascular;  Laterality: N/A;   NECK SURGERY     ROTATOR CUFF REPAIR Right    SPINE SURGERY      FAMILY HISTORY: Family History  Problem Relation Age of Onset   Cancer Mother 68       lung cancer   Diabetes Mother    Stroke Mother    Heart disease Father    Mental illness Brother        schizophrenia    SOCIAL HISTORY: Social History   Socioeconomic History   Marital status: Married    Spouse name: [place]   Number of children: 2   Years of education: 16   Highest education level: Bachelor's degree (e.g., BA, AB, BS)  Occupational History   Occupation: Ecologist: Printmaker strain: Not on file   Food insecurity    Worry: Not on file    Inability: Not on file   Transportation needs    Medical: Not on file    Non-medical: Not on file  Tobacco Use   Smoking status: Never Smoker   Smokeless tobacco: Never Used  Substance and Sexual Activity   Alcohol use: No    Alcohol/week: 0.0 standard drinks   Drug use: No   Sexual activity: Yes  Lifestyle   Physical activity    Days per week: Not on file    Minutes per session: Not on file   Stress: Not on file  Relationships   Social connections    Talks on phone: Not on file    Gets together: Not on file    Attends religious service: Not on file    Active member of club or organization: Not on file    Attends meetings of clubs or organizations: Not on file    Relationship status: Not on file   Intimate partner violence    Fear of current or ex partner: Not on file    Emotionally abused: Not on file    Physically abused: Not on  file    Forced sexual activity: Not on file  Other Topics Concern   Not on file  Social History Narrative   Lives with his wife and son.   Daughter lives independently nearby.      PHYSICAL EXAM  Vitals:   09/14/19 1302  BP: 124/76  Pulse: 72  Temp: 97.7 F (36.5 C)  TempSrc: Oral  Weight: 266 lb 6.4 oz (120.8 kg)  Height: 6' 1"  (1.854 m)   Body mass index is  35.15 kg/m. Generalized: Well developed, in no acute distress  Chest: Lungs clear to auscultation bilaterally  Neurological examination  Mentation: Alert oriented to time, place, history taking. Follows all commands speech and language fluent Cranial nerve II-XII: Extraocular movements were full, visual field were full on confrontational test Head turning and shoulder shrug  were normal and symmetric. Motor: The motor testing reveals 5 over 5 strength of all 4 extremities. Good symmetric motor tone is noted throughout.  Sensory: Sensory testing is intact to soft touch on all 4 extremities. No evidence of extinction is noted.  Gait and station: Gait is normal.    DIAGNOSTIC DATA (LABS, IMAGING, TESTING) - I reviewed patient records, labs, notes, testing and imaging myself where available.  Lab Results  Component Value Date   WBC 2.9 (L) 10/08/2017   HGB 14.9 10/08/2017   HCT 43.7 10/08/2017   MCV 90.3 10/08/2017   PLT 229 10/08/2017      Component Value Date/Time   NA 138 10/08/2017 1153   K 3.6 10/08/2017 1153   CL 101 10/08/2017 1153   CO2 29 10/08/2017 1153   GLUCOSE 85 10/08/2017 1153   BUN 27 (H) 10/08/2017 1153   CREATININE 1.37 (H) 10/08/2017 1153   CALCIUM 9.5 10/08/2017 1153   PROT 6.7 10/08/2017 1153   ALBUMIN 4.3 08/12/2017 1552   AST 29 10/08/2017 1153   ALT 18 10/08/2017 1153   ALKPHOS 59 08/12/2017 1552   BILITOT 1.0 10/08/2017 1153   GFRNONAA 54 (L) 08/12/2017 1552   GFRNONAA 75 10/01/2014 1052   GFRAA >60 08/12/2017 1552   GFRAA 87 10/01/2014 1052   Lab Results  Component Value  Date   CHOL 180 10/08/2017   HDL 68 10/08/2017   LDLCALC 98 10/08/2017   TRIG 49 10/08/2017   CHOLHDL 2.6 10/08/2017   Lab Results  Component Value Date   HGBA1C 5.7 (H) 10/08/2017    Lab Results  Component Value Date   TSH 0.52 10/08/2017      ASSESSMENT AND PLAN 58 y.o. year old male  has a past medical history of Allergy, Dyslipidemia, Hypertension, OA (osteoarthritis), Obesity, Obstructive sleep apnea, and Varicose veins of lower extremities with ulcer ([place]) (09/03/2014). here with:  1. Obstructive sleep apnea on CPAP  The patient's CPAP download shows excellent compliance and good treatment of his apnea.  He is encouraged to continue using CPAP nightly and greater than 4 hours each night.  He is advised that if his symptoms worsen or he develops new symptoms he should let us know.  He will follow-up in 1 year or sooner if needed    I spent 15 minutes with the patient. 50% of this time was spent reviewing CPAP download   Ward Givens, MSN, NP-C 09/14/2019, 1:13 PM [facility] [address] [place], [place] [address] [phone]  I reviewed the above note and documentation by the Nurse Practitioner and agree with the history, exam, assessment and plan as outlined above. I was available for consultation. Star Age, MD, PhD [facility])

## 2019-09-14 NOTE — Patient Instructions (Signed)
Continue using CPAP nightly and greater than 4 hours each night °If your symptoms worsen or you develop new symptoms please let us know.  ° °

## 2019-12-20 ENCOUNTER — Encounter (HOSPITAL_BASED_OUTPATIENT_CLINIC_OR_DEPARTMENT_OTHER): Payer: BLUE CROSS/BLUE SHIELD | Admitting: Physician Assistant

## 2020-03-30 ENCOUNTER — Encounter (HOSPITAL_COMMUNITY): Payer: Self-pay

## 2020-03-30 ENCOUNTER — Inpatient Hospital Stay (HOSPITAL_COMMUNITY)
Admission: EM | Admit: 2020-03-30 | Discharge: 2020-04-02 | DRG: 064 | Disposition: E | Payer: BC Managed Care – PPO | Attending: Neurology | Admitting: Neurology

## 2020-03-30 ENCOUNTER — Inpatient Hospital Stay (HOSPITAL_COMMUNITY): Payer: BC Managed Care – PPO

## 2020-03-30 ENCOUNTER — Emergency Department (HOSPITAL_COMMUNITY): Payer: BC Managed Care – PPO

## 2020-03-30 ENCOUNTER — Other Ambulatory Visit: Payer: Self-pay

## 2020-03-30 DIAGNOSIS — Z515 Encounter for palliative care: Secondary | ICD-10-CM | POA: Diagnosis not present

## 2020-03-30 DIAGNOSIS — E876 Hypokalemia: Secondary | ICD-10-CM | POA: Diagnosis not present

## 2020-03-30 DIAGNOSIS — G935 Compression of brain: Secondary | ICD-10-CM | POA: Diagnosis present

## 2020-03-30 DIAGNOSIS — G911 Obstructive hydrocephalus: Secondary | ICD-10-CM | POA: Diagnosis present

## 2020-03-30 DIAGNOSIS — J9601 Acute respiratory failure with hypoxia: Secondary | ICD-10-CM | POA: Diagnosis present

## 2020-03-30 DIAGNOSIS — I619 Nontraumatic intracerebral hemorrhage, unspecified: Secondary | ICD-10-CM | POA: Diagnosis present

## 2020-03-30 DIAGNOSIS — Z66 Do not resuscitate: Secondary | ICD-10-CM | POA: Diagnosis not present

## 2020-03-30 DIAGNOSIS — E669 Obesity, unspecified: Secondary | ICD-10-CM | POA: Diagnosis present

## 2020-03-30 DIAGNOSIS — N182 Chronic kidney disease, stage 2 (mild): Secondary | ICD-10-CM | POA: Diagnosis present

## 2020-03-30 DIAGNOSIS — R29732 NIHSS score 32: Secondary | ICD-10-CM | POA: Diagnosis present

## 2020-03-30 DIAGNOSIS — I614 Nontraumatic intracerebral hemorrhage in cerebellum: Secondary | ICD-10-CM

## 2020-03-30 DIAGNOSIS — I161 Hypertensive emergency: Secondary | ICD-10-CM | POA: Diagnosis present

## 2020-03-30 DIAGNOSIS — Z833 Family history of diabetes mellitus: Secondary | ICD-10-CM

## 2020-03-30 DIAGNOSIS — J690 Pneumonitis due to inhalation of food and vomit: Secondary | ICD-10-CM | POA: Diagnosis not present

## 2020-03-30 DIAGNOSIS — E873 Alkalosis: Secondary | ICD-10-CM | POA: Diagnosis not present

## 2020-03-30 DIAGNOSIS — G8101 Flaccid hemiplegia affecting right dominant side: Secondary | ICD-10-CM | POA: Diagnosis present

## 2020-03-30 DIAGNOSIS — I615 Nontraumatic intracerebral hemorrhage, intraventricular: Secondary | ICD-10-CM | POA: Diagnosis present

## 2020-03-30 DIAGNOSIS — Z4659 Encounter for fitting and adjustment of other gastrointestinal appliance and device: Secondary | ICD-10-CM

## 2020-03-30 DIAGNOSIS — Z20822 Contact with and (suspected) exposure to covid-19: Secondary | ICD-10-CM | POA: Diagnosis present

## 2020-03-30 DIAGNOSIS — G936 Cerebral edema: Secondary | ICD-10-CM | POA: Diagnosis present

## 2020-03-30 DIAGNOSIS — K7201 Acute and subacute hepatic failure with coma: Secondary | ICD-10-CM | POA: Diagnosis not present

## 2020-03-30 DIAGNOSIS — I616 Nontraumatic intracerebral hemorrhage, multiple localized: Secondary | ICD-10-CM | POA: Diagnosis not present

## 2020-03-30 DIAGNOSIS — Z6835 Body mass index (BMI) 35.0-35.9, adult: Secondary | ICD-10-CM

## 2020-03-30 DIAGNOSIS — M1990 Unspecified osteoarthritis, unspecified site: Secondary | ICD-10-CM | POA: Diagnosis present

## 2020-03-30 DIAGNOSIS — N179 Acute kidney failure, unspecified: Secondary | ICD-10-CM | POA: Diagnosis not present

## 2020-03-30 DIAGNOSIS — I468 Cardiac arrest due to other underlying condition: Secondary | ICD-10-CM | POA: Diagnosis present

## 2020-03-30 DIAGNOSIS — R739 Hyperglycemia, unspecified: Secondary | ICD-10-CM | POA: Diagnosis not present

## 2020-03-30 DIAGNOSIS — Z823 Family history of stroke: Secondary | ICD-10-CM

## 2020-03-30 DIAGNOSIS — Z8249 Family history of ischemic heart disease and other diseases of the circulatory system: Secondary | ICD-10-CM

## 2020-03-30 DIAGNOSIS — R1310 Dysphagia, unspecified: Secondary | ICD-10-CM | POA: Diagnosis present

## 2020-03-30 DIAGNOSIS — Z801 Family history of malignant neoplasm of trachea, bronchus and lung: Secondary | ICD-10-CM

## 2020-03-30 DIAGNOSIS — G4733 Obstructive sleep apnea (adult) (pediatric): Secondary | ICD-10-CM | POA: Diagnosis present

## 2020-03-30 DIAGNOSIS — E785 Hyperlipidemia, unspecified: Secondary | ICD-10-CM | POA: Diagnosis present

## 2020-03-30 DIAGNOSIS — Z79899 Other long term (current) drug therapy: Secondary | ICD-10-CM

## 2020-03-30 DIAGNOSIS — Z9119 Patient's noncompliance with other medical treatment and regimen: Secondary | ICD-10-CM

## 2020-03-30 DIAGNOSIS — I4891 Unspecified atrial fibrillation: Secondary | ICD-10-CM | POA: Diagnosis present

## 2020-03-30 DIAGNOSIS — I1310 Hypertensive heart and chronic kidney disease without heart failure, with stage 1 through stage 4 chronic kidney disease, or unspecified chronic kidney disease: Secondary | ICD-10-CM | POA: Diagnosis present

## 2020-03-30 LAB — I-STAT CHEM 8, ED
BUN: 32 mg/dL — ABNORMAL HIGH (ref 6–20)
Calcium, Ion: 1.14 mmol/L — ABNORMAL LOW (ref 1.15–1.40)
Chloride: 102 mmol/L (ref 98–111)
Creatinine, Ser: 1.4 mg/dL — ABNORMAL HIGH (ref 0.61–1.24)
Glucose, Bld: 196 mg/dL — ABNORMAL HIGH (ref 70–99)
HCT: 50 % (ref 39.0–52.0)
Hemoglobin: 17 g/dL (ref 13.0–17.0)
Potassium: 3.2 mmol/L — ABNORMAL LOW (ref 3.5–5.1)
Sodium: 141 mmol/L (ref 135–145)
TCO2: 27 mmol/L (ref 22–32)

## 2020-03-30 LAB — COMPREHENSIVE METABOLIC PANEL
ALT: 25 U/L (ref 0–44)
AST: 42 U/L — ABNORMAL HIGH (ref 15–41)
Albumin: 4.5 g/dL (ref 3.5–5.0)
Alkaline Phosphatase: 56 U/L (ref 38–126)
Anion gap: 14 (ref 5–15)
BUN: 26 mg/dL — ABNORMAL HIGH (ref 6–20)
CO2: 24 mmol/L (ref 22–32)
Calcium: 9.5 mg/dL (ref 8.9–10.3)
Chloride: 103 mmol/L (ref 98–111)
Creatinine, Ser: 1.38 mg/dL — ABNORMAL HIGH (ref 0.61–1.24)
GFR calc Af Amer: >60 mL/min (ref >60)
GFR calc non Af Amer: 56 mL/min — ABNORMAL LOW (ref >60)
Glucose, Bld: 197 mg/dL — ABNORMAL HIGH (ref 70–99)
Potassium: 3.3 mmol/L — ABNORMAL LOW (ref 3.5–5.1)
Sodium: 141 mmol/L (ref 135–145)
Total Bilirubin: 0.7 mg/dL (ref 0.3–1.2)
Total Protein: 7.7 g/dL (ref 6.5–8.1)

## 2020-03-30 LAB — DIFFERENTIAL
Abs Immature Granulocytes: 0.03 10*3/uL (ref 0.00–0.07)
Basophils Absolute: 0 10*3/uL (ref 0.0–0.1)
Basophils Relative: 1 %
Eosinophils Absolute: 0.1 10*3/uL (ref 0.0–0.5)
Eosinophils Relative: 1 %
Immature Granulocytes: 0 %
Lymphocytes Relative: 15 %
Lymphs Abs: 1.3 10*3/uL (ref 0.7–4.0)
Monocytes Absolute: 0.5 10*3/uL (ref 0.1–1.0)
Monocytes Relative: 5 %
Neutro Abs: 6.5 10*3/uL (ref 1.7–7.7)
Neutrophils Relative %: 78 %

## 2020-03-30 LAB — CBC
HCT: 49.8 % (ref 39.0–52.0)
Hemoglobin: 15.9 g/dL (ref 13.0–17.0)
MCH: 30.5 pg (ref 26.0–34.0)
MCHC: 31.9 g/dL (ref 30.0–36.0)
MCV: 95.6 fL (ref 80.0–100.0)
Platelets: 220 10*3/uL (ref 150–400)
RBC: 5.21 MIL/uL (ref 4.22–5.81)
RDW: 14.3 % (ref 11.5–15.5)
WBC: 8.4 10*3/uL (ref 4.0–10.5)
nRBC: 0 % (ref 0.0–0.2)

## 2020-03-30 LAB — TRIGLYCERIDES: Triglycerides: 43 mg/dL (ref <150)

## 2020-03-30 LAB — PROTIME-INR
INR: 0.9 (ref 0.8–1.2)
Prothrombin Time: 11.9 seconds (ref 11.4–15.2)

## 2020-03-30 LAB — CBG MONITORING, ED: Glucose-Capillary: 211 mg/dL — ABNORMAL HIGH (ref 70–99)

## 2020-03-30 LAB — MRSA PCR SCREENING: MRSA by PCR: NEGATIVE

## 2020-03-30 LAB — APTT: aPTT: 25 seconds (ref 24–36)

## 2020-03-30 MED ORDER — STROKE: EARLY STAGES OF RECOVERY BOOK: Freq: Once | Status: AC

## 2020-03-30 MED ORDER — ACETAMINOPHEN 650 MG RE SUPP: 650.0000 mg | RECTAL | Status: DC | PRN

## 2020-03-30 MED ORDER — PANTOPRAZOLE SODIUM 40 MG IV SOLR
40.0000 mg | Freq: Every day | INTRAVENOUS | Status: DC
  Administered 2020-03-30 – 2020-03-31 (×2): 40 mg via INTRAVENOUS
  Filled 2020-03-30 (×2): qty 40

## 2020-03-30 MED ORDER — MANNITOL 25 % IV SOLN: 25.0000 g | Freq: Once | INTRAVENOUS | Status: DC

## 2020-03-30 MED ORDER — ACETAMINOPHEN 325 MG PO TABS: 650.0000 mg | ORAL_TABLET | ORAL | Status: DC | PRN

## 2020-03-30 MED ORDER — MANNITOL 20 % IV SOLN
100.0000 g | Freq: Once | Status: AC
  Administered 2020-03-30: 100 g via INTRAVENOUS
  Filled 2020-03-30: qty 500

## 2020-03-30 MED ORDER — PROPOFOL 1000 MG/100ML IV EMUL
0.0000 ug/kg/min | INTRAVENOUS | Status: DC
  Filled 2020-03-30: qty 100

## 2020-03-30 MED ORDER — ACETAMINOPHEN 160 MG/5ML PO SOLN
650.0000 mg | ORAL | Status: DC | PRN
  Filled 2020-03-30: qty 20.3

## 2020-03-30 MED ORDER — FENTANYL BOLUS VIA INFUSION
50.0000 ug | INTRAVENOUS | Status: DC | PRN
  Filled 2020-03-30: qty 50

## 2020-03-30 MED ORDER — DOCUSATE SODIUM 50 MG/5ML PO LIQD
100.0000 mg | Freq: Two times a day (BID) | ORAL | Status: DC
  Administered 2020-03-31 (×2): 100 mg
  Filled 2020-03-30: qty 10

## 2020-03-30 MED ORDER — FENTANYL CITRATE (PF) 100 MCG/2ML IJ SOLN: 50.0000 ug | Freq: Once | INTRAMUSCULAR | Status: DC

## 2020-03-30 MED ORDER — SENNOSIDES 8.8 MG/5ML PO SYRP
5.0000 mL | ORAL_SOLUTION | Freq: Two times a day (BID) | ORAL | Status: DC
  Administered 2020-03-31 (×2): 5 mL
  Filled 2020-03-30: qty 5

## 2020-03-30 MED ORDER — CLEVIDIPINE BUTYRATE 0.5 MG/ML IV EMUL
INTRAVENOUS | Status: AC
  Administered 2020-03-30: 1 mg/h via INTRAVENOUS
  Filled 2020-03-30: qty 50

## 2020-03-30 MED ORDER — CLEVIDIPINE BUTYRATE 0.5 MG/ML IV EMUL
0.0000 mg/h | INTRAVENOUS | Status: DC
  Administered 2020-03-30: 2 mg/h via INTRAVENOUS
  Administered 2020-04-01: 1 mg/h via INTRAVENOUS
  Filled 2020-03-30: qty 150
  Filled 2020-03-30 (×2): qty 50

## 2020-03-30 MED ORDER — ORAL CARE MOUTH RINSE
15.0000 mL | OROMUCOSAL | Status: DC
  Administered 2020-03-30 – 2020-04-01 (×19): 15 mL via OROMUCOSAL

## 2020-03-30 MED ORDER — FENTANYL 2500MCG IN NS 250ML (10MCG/ML) PREMIX INFUSION
25.0000 ug/h | INTRAVENOUS | Status: DC
  Filled 2020-03-30: qty 250

## 2020-03-30 MED ORDER — SODIUM CHLORIDE 0.9% FLUSH: 3.0000 mL | Freq: Once | INTRAVENOUS | Status: DC

## 2020-03-30 MED ORDER — CHLORHEXIDINE GLUCONATE 0.12% ORAL RINSE (MEDLINE KIT)
15.0000 mL | Freq: Two times a day (BID) | OROMUCOSAL | Status: DC
  Administered 2020-03-31 – 2020-04-01 (×3): 15 mL via OROMUCOSAL

## 2020-03-30 MED ORDER — CHLORHEXIDINE GLUCONATE CLOTH 2 % EX PADS: 6.0000 | MEDICATED_PAD | Freq: Every day | CUTANEOUS | Status: DC

## 2020-03-30 MED ORDER — SENNOSIDES-DOCUSATE SODIUM 8.6-50 MG PO TABS: 1.0000 | ORAL_TABLET | Freq: Two times a day (BID) | ORAL | Status: DC

## 2020-03-30 MED ORDER — LABETALOL HCL 5 MG/ML IV SOLN
10.0000 mg | Freq: Once | INTRAVENOUS | Status: AC
  Administered 2020-04-01: 10 mg via INTRAVENOUS
  Filled 2020-03-30: qty 4

## 2020-03-30 NOTE — Progress Notes (Signed)
The chaplain responded to a page for support from the Emergency department. The patient came in as a code stroke and the family needed support as the patient also suffered a code blue. The chaplain provided support, but the daughter had very healthy spirituality and support system. The chaplain is available to provide support as needed and as other family members arrive.  Lavone Neri Chaplain Resident For questions concerning this note please contact me by pager [phone]

## 2020-03-30 NOTE — Code Documentation (Signed)
Dr. Lynelle Doctor attempting intubation with 7.5 tube.

## 2020-03-30 NOTE — ED Triage Notes (Signed)
PT BIB [facility] from home c/o of headache and dizziness. Pt was mowing his lawn at 1pm. Pt finished and became dizzy. Pt stated it takes about an hr and half to mow the lawn. Pt ran into house. Pt almost fell over so pt called EMS. EMS stated pt was alert and oriented and talking. Pt walked himself out to the stretcher. Pt has a hx of HTN and does not take his BP meds. Upon arrival at the [facility] pt became unresponsive. Code Stroke initiated and pt rushed to CT.

## 2020-03-30 NOTE — H&P (Signed)
Chief Complaint: headache, unresponsive   History obtained from: Chart   HPI:                                                                                                                                       Kristopher Garcia is a 59 y.o. male with PMH with uncontrolled HTN, obesity, OSA presented with sudden onset vomiting and headache. Brought by EMS but not code stroke as apparently patient did not have any neurological deficits. BP >240 systolic.LKN was 4.20 pm  Sudden change at bridge at 5.15 pm. On assessment by EDP- noted have right side flaccid weakness- immediately taken to CT scan and code stroke activated.  CT head showed massive left cerebellar hemorrhage with IVH and mass effect on brainstem.   On assessment in CT scanner, patient comatose, pupils fixed and dilated. Taken immediately back to ER for emergent airway intubation. Pulse not felt and CPR initiated by ED team.  Stat Mannitol and recommended emergent NS consult.    Date last known well: 5.29.21 Time last known well: 4.20 pm tPA Given: no, hemorrhage  NIHSS: 32 Baseline MRS   ICH score 4 (:2 points for GCS,  1 for IVH, 1 pt infratentorial hemorrhage. )  Past Medical History:  Diagnosis Date  . Allergy   . Dyslipidemia   . Hypertension   . OA (osteoarthritis)   . Obesity   . Obstructive sleep apnea   . Varicose veins of lower extremities with ulcer ([facility]) 09/03/2014    Past Surgical History:  Procedure Laterality Date  . CARDIAC CATHETERIZATION  06/21/12  . LEFT HEART CATHETERIZATION WITH CORONARY ANGIOGRAM N/A 06/21/2012   Procedure: LEFT HEART CATHETERIZATION WITH CORONARY ANGIOGRAM;  Surgeon: Donato Schultz, MD;  Location: [facility] CATH LAB;  Service: Cardiovascular;  Laterality: N/A;  . NECK SURGERY    . ROTATOR CUFF REPAIR Right   . SPINE SURGERY      Family History  Problem Relation Age of Onset  . Cancer Mother 46       lung cancer  . Diabetes Mother   . Stroke Mother   . Heart disease Father    . Mental illness Brother        schizophrenia   Social History:  reports that he has never smoked. He has never used smokeless tobacco. He reports that he does not drink alcohol or use drugs.  Allergies:  Allergies  Allergen Reactions  . Bee Venom Swelling    Medications:  I reviewed home medications   ROS:                                                                                                                                     Unable to obtain due to mental status    Examination:                                                                                                      General: Appears well-developed  Psych: Affect appropriate to situation Eyes: No scleral injection HENT: No OP obstrucion Head: Normocephalic.  Cardiovascular: Unable to feel pulse  Respiratory: Effort normal and breath sounds normal to anterior ascultation GI: Soft.  No distension. There is no tenderness.  Skin: WDI    Neurological Examination Mental Status: Patient does not respond to verbal stimuli.  Does not respond to deep sternal rub.  Does not follow commands.  No verbalizations noted.  Cranial Nerves: II:  III,IV,VI: doll's response absent bilaterally. V,VII: corneal reflex: absent bilaterally VIII: patient does not respond to verbal stimuli IX,X: gag reflex: absent XI: trapezius strength unable to test bilaterally XII: tongue strength unable to test Motor: Extremities flaccid throughout.  No spontaneous movement noted.  No purposeful movements noted.  Sensory: Does not respond to noxious stimuli in any extremity. Deep Tendon Reflexes:  Absent throughout. Plantars: mute  Cerebellar: Unable to perform   Lab Results: Basic Metabolic Panel: Recent Labs  Lab 2020-04-06 1740  NA 141  K 3.2*  CL 102  GLUCOSE 196*  BUN 32*  CREATININE 1.40*     CBC: Recent Labs  Lab 2020-04-06 1737 04/06/2020 1740  WBC 8.4  --   NEUTROABS 6.5  --   HGB 15.9 17.0  HCT 49.8 50.0  MCV 95.6  --   PLT 220  --     Coagulation Studies: Recent Labs    04/06/2020 1737  LABPROT 11.9  INR 0.9    Imaging: CT HEAD CODE STROKE WO CONTRAST  Result Date: Apr 06, 2020 CLINICAL DATA:  Code stroke.  Acute neuro deficit.  Dizziness. EXAM: CT HEAD WITHOUT CONTRAST TECHNIQUE: Contiguous axial images were obtained from the base of the skull through the vertex without intravenous contrast. COMPARISON:  None. FINDINGS: Brain: Large area of acute hemorrhage in the left cerebellum measuring 4 x 6 cm. There is extension into the fourth ventricle and spread of blood into the third and lateral ventricles. There is mild obstructive hydrocephalus. There is mass-effect on the brainstem due to the  hematoma. No acute ischemic infarct.  No mass lesion identified. Vascular: Negative for hyperdense vessel Skull: Negative Sinuses/Orbits: None Other: ASPECTS ([place] Stroke Program Early CT Score) -not applicable due to hemorrhage. IMPRESSION: 1. 4 x 6 cm acute hematoma left cerebellum with extension into the ventricles. There is mass-effect on the brainstem and obstructive mild hydrocephalus. These results were called by telephone at the time of interpretation on 03/19/2020 at 5:51 pm to provider Ishaaq Penna, who verbally acknowledged these results. Electronically Signed   By: Franchot Gallo M.D.   On: 03/31/2020 17:51     ASSESSMENT AND PLAN  59 y.o. male with PMH with uncontrolled HTN, obesity, OSA presented with sudden onset vomiting, headache followed by becoming unresponsive and comatose. CT head with large left cerebellar hemorrhage with brainstem compression. CPR initiated following CT head as pt stopped breathing. Pupils fixed and dilated prior to intubation. Patient not on any blood thinners    Acute Left Cerebellar Hemorrhage with IVH  Cerebral edema with brainstem mass  effect and hydrocephalus Coma HTN emergency with uncotrolled HTN Obesity   Plan Mannitol IV 100gm stat  BP less than 160 mmHg, cleviprex ordered, later discontinued as BP now less than 503 systolic.  No antiplatelet/anticoagulant Emergent NS consult  Very poor prognosis    This patient is neurologically critically ill due to [place].   He is at risk for significant risk of neurological worsening from cerebral edema,  death from brain herniation, heart failure,  infection, respiratory failure and seizure. This patient's care requires constant monitoring of vital signs, hemodynamics, respiratory and cardiac monitoring, review of multiple databases, neurological assessment, discussion with family, other specialists and medical decision making of high complexity.  I spent 45  minutes of neurocritical time in the care of this patient.

## 2020-03-30 NOTE — Consult Note (Signed)
Chief Complaint   Chief Complaint  Patient presents with  . Code Stroke    HPI   Consult requested by: Dr Aroor Neurology Reason for consult: ICH  HPI: Kristopher Garcia is a 59 y.o. male with history HTN, OSA who presented to ED with acute onset N/V and headache. When he arrived to ED, right side noted to be flaccid (approx 1715). Upon arrival to [place], neurology noted patient to be comatose with fixed and dilated pupils. CT reveled a large cerebellar hemorrhage. He was taken back to room where he coded. Pulses were palpable after CPR.  A NSY consultation was requested for ICH.  Patient Active Problem List   Diagnosis Date Noted  . ICH (intracerebral hemorrhage) ([place]) 03/27/2020  . Reflux esophagitis 03/18/2018  . Hiatal hernia 03/18/2018  . Varicose veins of leg with swelling, bilateral 10/08/2017  . OSA (obstructive sleep apnea) 08/02/2014  . Essential hypertension 05/04/2014  . BMI 35.0-35.9,adult 05/04/2014    PMH: Past Medical History:  Diagnosis Date  . Allergy   . Dyslipidemia   . Hypertension   . OA (osteoarthritis)   . Obesity   . Obstructive sleep apnea   . Varicose veins of lower extremities with ulcer ([place]) 09/03/2014    PSH: Past Surgical History:  Procedure Laterality Date  . CARDIAC CATHETERIZATION  06/21/12  . LEFT HEART CATHETERIZATION WITH CORONARY ANGIOGRAM N/A 06/21/2012   Procedure: LEFT HEART CATHETERIZATION WITH CORONARY ANGIOGRAM;  Surgeon: Candee Furbish, MD;  Location: [facility] CATH LAB;  Service: Cardiovascular;  Laterality: N/A;  . NECK SURGERY    . ROTATOR CUFF REPAIR Right   . SPINE SURGERY      (Not in a hospital admission)   SH: Social History   Tobacco Use  . Smoking status: Never Smoker  . Smokeless tobacco: Never Used  Substance Use Topics  . Alcohol use: No    Alcohol/week: 0.0 standard drinks  . Drug use: No    MEDS: Prior to Admission medications   Medication Sig Start Date End Date Taking? Authorizing Provider    amLODipine (NORVASC) 10 MG tablet Take 1 tablet (10 mg total) by mouth daily. 10/08/17   Harrison Mons, PA  lisinopril (PRINIVIL,ZESTRIL) 10 MG tablet Take 1 tablet (10 mg total) by mouth daily. 02/11/18   Harrison Mons, PA  lisinopril-hydrochlorothiazide (PRINZIDE,ZESTORETIC) 20-25 MG tablet Take 1 tablet by mouth daily. Patient not taking: Reported on 09/14/2019 02/11/18   Harrison Mons, PA    ALLERGY: Allergies  Allergen Reactions  . Bee Venom Swelling    Social History   Tobacco Use  . Smoking status: Never Smoker  . Smokeless tobacco: Never Used  Substance Use Topics  . Alcohol use: No    Alcohol/week: 0.0 standard drinks     Family History  Problem Relation Age of Onset  . Cancer Mother 3       lung cancer  . Diabetes Mother   . Stroke Mother   . Heart disease Father   . Mental illness Brother        schizophrenia     ROS   ROS  Exam   Vitals:   03/10/2020 1836 03/18/2020 1838  BP: 103/60 101/61  Pulse: 75 76  Resp: 18 18  SpO2: 100% 100%   WDWN, NAD Pupils fixed and dilated Negative corneal, gag, cough Dolls eyes absent No response to central pain  Results - Imaging/Labs   Results for orders placed or performed during the hospital encounter of 03/16/2020 (from  the past 48 hour(s))  Protime-INR     Status: None   Collection Time: 03/11/2020  5:37 PM  Result Value Ref Range   Prothrombin Time 11.9 11.4 - 15.2 seconds   INR 0.9 0.8 - 1.2    Comment: (NOTE) INR goal varies based on device and disease states. Performed at [facility], [address], [place], [place] [address]   APTT     Status: None   Collection Time: 03/02/2020  5:37 PM  Result Value Ref Range   aPTT 25 24 - 36 seconds    Comment: Performed at [facility], [address] [place], [place] [address]  CBC     Status: None   Collection Time: 03/29/2020  5:37 PM  Result Value Ref Range   WBC 8.4 4.0 - 10.5 K/uL   RBC 5.21 4.22 - 5.81 MIL/uL   Hemoglobin 15.9 13.0 -  17.0 g/dL   HCT 57.8 46.9 - 62.9 %   MCV 95.6 80.0 - 100.0 fL   MCH 30.5 26.0 - 34.0 pg   MCHC 31.9 30.0 - 36.0 g/dL   RDW 52.8 41.3 - 24.4 %   Platelets 220 150 - 400 K/uL   nRBC 0.0 0.0 - 0.2 %    Comment: Performed at [facility], [address], [place], [place] [address]  Differential     Status: None   Collection Time: 03/20/2020  5:37 PM  Result Value Ref Range   Neutrophils Relative % 78 %   Neutro Abs 6.5 1.7 - 7.7 K/uL   Lymphocytes Relative 15 %   Lymphs Abs 1.3 0.7 - 4.0 K/uL   Monocytes Relative 5 %   Monocytes Absolute 0.5 0.1 - 1.0 K/uL   Eosinophils Relative 1 %   Eosinophils Absolute 0.1 0.0 - 0.5 K/uL   Basophils Relative 1 %   Basophils Absolute 0.0 0.0 - 0.1 K/uL   Immature Granulocytes 0 %   Abs Immature Granulocytes 0.03 0.00 - 0.07 K/uL    Comment: Performed at [facility], [address] [place], [place] [address]  Comprehensive metabolic panel     Status: Abnormal   Collection Time: 03/19/2020  5:37 PM  Result Value Ref Range   Sodium 141 135 - 145 mmol/L   Potassium 3.3 (L) 3.5 - 5.1 mmol/L   Chloride 103 98 - 111 mmol/L   CO2 24 22 - 32 mmol/L   Glucose, Bld 197 (H) 70 - 99 mg/dL    Comment: Glucose reference range applies only to samples taken after fasting for at least 8 hours.   BUN 26 (H) 6 - 20 mg/dL   Creatinine, Ser 4.40 (H) 0.61 - 1.24 mg/dL   Calcium 9.5 8.9 - 34.7 mg/dL   Total Protein 7.7 6.5 - 8.1 g/dL   Albumin 4.5 3.5 - 5.0 g/dL   AST 42 (H) 15 - 41 U/L   ALT 25 0 - 44 U/L   Alkaline Phosphatase 56 38 - 126 U/L   Total Bilirubin 0.7 0.3 - 1.2 mg/dL   GFR calc non Af Amer 56 (L) >60 mL/min   GFR calc Af Amer >60 >60 mL/min   Anion gap 14 5 - 15    Comment: Performed at [facility], [address] [place], [place] [address]  I-stat chem 8, ED     Status: Abnormal   Collection Time: 03/29/2020  5:40 PM  Result Value Ref Range   Sodium  141 135 - 145 mmol/L   Potassium 3.2 (L) 3.5 - 5.1 mmol/L   Chloride 102 98 -  111 mmol/L   BUN 32 (H) 6 - 20 mg/dL   Creatinine, Ser 3.47 (H) 0.61 - 1.24 mg/dL   Glucose, Bld 425 (H) 70 - 99 mg/dL    Comment: Glucose reference range applies only to samples taken after fasting for at least 8 hours.   Calcium, Ion 1.14 (L) 1.15 - 1.40 mmol/L   TCO2 27 22 - 32 mmol/L   Hemoglobin 17.0 13.0 - 17.0 g/dL   HCT 95.6 38.7 - 56.4 %  Triglycerides     Status: None   Collection Time: Apr 28, 2020  6:23 PM  Result Value Ref Range   Triglycerides 43 <150 mg/dL    Comment: Performed at [facility], [address], [place], [place] [address]    CT HEAD CODE STROKE WO CONTRAST  Result Date: Apr 28, 2020 CLINICAL DATA:  Code stroke.  Acute neuro deficit.  Dizziness. EXAM: CT HEAD WITHOUT CONTRAST TECHNIQUE: Contiguous axial images were obtained from the base of the skull through the vertex without intravenous contrast. COMPARISON:  None. FINDINGS: Brain: Large area of acute hemorrhage in the left cerebellum measuring 4 x 6 cm. There is extension into the fourth ventricle and spread of blood into the third and lateral ventricles. There is mild obstructive hydrocephalus. There is mass-effect on the brainstem due to the hematoma. No acute ischemic infarct.  No mass lesion identified. Vascular: Negative for hyperdense vessel Skull: Negative Sinuses/Orbits: None Other: ASPECTS ([place] Stroke Program Early CT Score) -not applicable due to hemorrhage. IMPRESSION: 1. 4 x 6 cm acute hematoma left cerebellum with extension into the ventricles. There is mass-effect on the brainstem and obstructive mild hydrocephalus. These results were called by telephone at the time of interpretation on April 28, 2020 at 5:51 pm to provider Aroor, who verbally acknowledged these results. Electronically Signed   By: Marlan Palau M.D.   On: 28-Apr-2020 17:51   Impression/Plan   59 y.o. male who presented with N/V, HA who had acute decompensation in ED & was found to have a large cerebellar hemorrhage. He has no  evidence of brainstem reflexes on exam. Imaging reviewed with Dr Conchita Paris and also discussed with Dr Laurence Slate. There is unfortunately no role for NS intervention. This a fatal injury. Patient has been made DNR. Daughter on way from [place] at which time family will discuss one was extubation.  Cindra Presume, PA-C [facility]

## 2020-03-30 NOTE — Code Documentation (Signed)
Intubation successful

## 2020-03-30 NOTE — Consult Note (Addendum)
Please see H and P

## 2020-03-30 NOTE — Code Documentation (Signed)
1 of epi pushed.

## 2020-03-30 NOTE — Code Documentation (Signed)
Pulse check. Pulse back. CPR stopped.

## 2020-03-30 NOTE — ED Provider Notes (Signed)
[facility] EMERGENCY DEPARTMENT Provider Note   CSN: [id number] Arrival date & time: 03/18/2020  1732     History Chief Complaint  Patient presents with  . Code Stroke    Kristopher Garcia is a 59 y.o. male.  HPI   59 y/o male with a h/o allergies, dyslipidemia, HTN, OA, obesity, OSA, varicose veins, who presents to the ED today via EMS who was initially called out for HA and dizziness.   Per EMS, pt was mowing the lawn around 1pm, about 1 hour later he c/o a HA and dizziness. He also had an episode of vomiting. En route, he was hypertensive, but was alert and oriented. He was ambulatory and moving all extremities normally. Upon arriving to the ED, he became unresponsive at the bridge at about 5:15PM.   On my eval pt unable to provide any further hx due to AMS therefore there is a level 5 caveat. Code stroke was activated.   Past Medical History:  Diagnosis Date  . Allergy   . Dyslipidemia   . Hypertension   . OA (osteoarthritis)   . Obesity   . Obstructive sleep apnea   . Varicose veins of lower extremities with ulcer ([place]) 09/03/2014    Patient Active Problem List   Diagnosis Date Noted  . ICH (intracerebral hemorrhage) ([place]) 03/11/2020  . Reflux esophagitis 03/18/2018  . Hiatal hernia 03/18/2018  . Varicose veins of leg with swelling, bilateral 10/08/2017  . OSA (obstructive sleep apnea) 08/02/2014  . Essential hypertension 05/04/2014  . BMI 35.0-35.9,adult 05/04/2014    Past Surgical History:  Procedure Laterality Date  . CARDIAC CATHETERIZATION  06/21/12  . LEFT HEART CATHETERIZATION WITH CORONARY ANGIOGRAM N/A 06/21/2012   Procedure: LEFT HEART CATHETERIZATION WITH CORONARY ANGIOGRAM;  Surgeon: Candee Furbish, MD;  Location: [facility] CATH LAB;  Service: Cardiovascular;  Laterality: N/A;  . NECK SURGERY    . ROTATOR CUFF REPAIR Right   . SPINE SURGERY         Family History  Problem Relation Age of Onset  . Cancer Mother 57       lung cancer  .  Diabetes Mother   . Stroke Mother   . Heart disease Father   . Mental illness Brother        schizophrenia    Social History   Tobacco Use  . Smoking status: Never Smoker  . Smokeless tobacco: Never Used  Substance Use Topics  . Alcohol use: No    Alcohol/week: 0.0 standard drinks  . Drug use: No    Home Medications Prior to Admission medications   Medication Sig Start Date End Date Taking? Authorizing Provider  amLODipine (NORVASC) 10 MG tablet Take 1 tablet (10 mg total) by mouth daily. 10/08/17   Harrison Mons, PA  lisinopril (PRINIVIL,ZESTRIL) 10 MG tablet Take 1 tablet (10 mg total) by mouth daily. 02/11/18   Harrison Mons, PA  lisinopril-hydrochlorothiazide (PRINZIDE,ZESTORETIC) 20-25 MG tablet Take 1 tablet by mouth daily. Patient not taking: Reported on 09/14/2019 02/11/18   Harrison Mons, PA    Allergies    Bee venom  Review of Systems   Review of Systems  Unable to perform ROS: Mental status change    Physical Exam Updated Vital Signs BP 138/73   Pulse 65   Temp (!) 97.4 F (36.3 C) (Axillary)   Resp 18   Ht 6' 1"  (1.854 m)   Wt 120.7 kg   SpO2 98%   BMI 35.09 kg/m   Physical  Exam Vitals and nursing note reviewed.  Constitutional:      Appearance: He is well-developed.  HENT:     Head: Normocephalic.  Eyes:     Conjunctiva/sclera: Conjunctivae normal.     Pupils: Pupils are equal, round, and reactive to light.  Cardiovascular:     Rate and Rhythm: Normal rate and regular rhythm.  Pulmonary:     Effort: Pulmonary effort is normal.     Breath sounds: Normal breath sounds.  Abdominal:     Palpations: Abdomen is soft.     Tenderness: There is no abdominal tenderness.  Musculoskeletal:     Cervical back: Neck supple.  Skin:    General: Skin is warm and dry.  Neurological:     Mental Status: He is alert.     Comments: Mental Status:  nonresponsive and nonverbal.  Cranial Nerves:  II:   pupils equal, round, reactive to light III,IV, VI:  ptosis not present, unable to test EOMs V,VII: no obvious facial droop but unable to follow commands and smile X: unable to evaluate XI:  unable to evaluate XII:  unable to evaluate Motor:  Normal tone. Globally weak, able to squeeze left hand, unable to move RUE, minimally able to lift LLE, unable to move RLE Sensory:  unable to evaluate Cerebellar:  unable to evaluate Gait:  unable to evaluate     ED Results / Procedures / Treatments   Labs (all labs ordered are listed, but only abnormal results are displayed) Labs Reviewed  COMPREHENSIVE METABOLIC PANEL - Abnormal; Notable for the following components:      Result Value   Potassium 3.3 (*)    Glucose, Bld 197 (*)    BUN 26 (*)    Creatinine, Ser 1.38 (*)    AST 42 (*)    GFR calc non Af Amer 56 (*)    All other components within normal limits  I-STAT CHEM 8, ED - Abnormal; Notable for the following components:   Potassium 3.2 (*)    BUN 32 (*)    Creatinine, Ser 1.40 (*)    Glucose, Bld 196 (*)    Calcium, Ion 1.14 (*)    All other components within normal limits  CBG MONITORING, ED - Abnormal; Notable for the following components:   Glucose-Capillary 211 (*)    All other components within normal limits  MRSA PCR SCREENING  PROTIME-INR  APTT  CBC  DIFFERENTIAL  TRIGLYCERIDES    EKG None  Radiology DG Abd 1 View  Result Date: 03/05/2020 CLINICAL DATA:  Evaluate OG tube placement EXAM: ABDOMEN - 1 VIEW COMPARISON:  None. FINDINGS: Stomach is somewhat distended with air. The OG tube terminates in the region the gastric body. IMPRESSION: Mild distension of the stomach with air. The OG tube terminates within the gastric body. Electronically Signed   By: Dorise Bullion III M.D   On: 03/03/2020 21:50   DG Chest Portable 1 View  Result Date: 03/05/2020 CLINICAL DATA:  Intubation EXAM: PORTABLE CHEST 1 VIEW COMPARISON:  None. FINDINGS: Mild cardiomegaly. Hazy left parahilar opacities. The endotracheal tube tip is just  above the carina. Enteric tube tip and side port are beyond the field of view. IMPRESSION: 1. Endotracheal tube tip just above the carina. 2. Hazy left parahilar opacities, possibly pneumonia. Electronically Signed   By: Ulyses Jarred M.D.   On: 03/02/2020 22:55   CT HEAD CODE STROKE WO CONTRAST  Result Date: 03/05/2020 CLINICAL DATA:  Code stroke.  Acute neuro deficit.  Dizziness. EXAM: CT HEAD WITHOUT CONTRAST TECHNIQUE: Contiguous axial images were obtained from the base of the skull through the vertex without intravenous contrast. COMPARISON:  None. FINDINGS: Brain: Large area of acute hemorrhage in the left cerebellum measuring 4 x 6 cm. There is extension into the fourth ventricle and spread of blood into the third and lateral ventricles. There is mild obstructive hydrocephalus. There is mass-effect on the brainstem due to the hematoma. No acute ischemic infarct.  No mass lesion identified. Vascular: Negative for hyperdense vessel Skull: Negative Sinuses/Orbits: None Other: ASPECTS ([place] Stroke Program Early CT Score) -not applicable due to hemorrhage. IMPRESSION: 1. 4 x 6 cm acute hematoma left cerebellum with extension into the ventricles. There is mass-effect on the brainstem and obstructive mild hydrocephalus. These results were called by telephone at the time of interpretation on 03/20/2020 at 5:51 pm to provider Aroor, who verbally acknowledged these results. Electronically Signed   By: Franchot Gallo M.D.   On: 03/02/2020 17:51    Procedures Procedures (including critical care time)  CRITICAL CARE Performed by: Rodney Booze   Total critical care time: 30 minutes  Critical care time was exclusive of separately billable procedures and treating other patients.  Critical care was necessary to treat or prevent imminent or life-threatening deterioration.  Critical care was time spent personally by me on the following activities: development of treatment plan with patient and/or  surrogate as well as nursing, discussions with consultants, evaluation of patient's response to treatment, examination of patient, obtaining history from patient or surrogate, ordering and performing treatments and interventions, ordering and review of laboratory studies, ordering and review of radiographic studies, pulse oximetry and re-evaluation of patient's condition.   Medications Ordered in ED Medications  sodium chloride flush (NS) 0.9 % injection 3 mL (has no administration in time range)  labetalol (NORMODYNE) injection 10 mg (0 mg Intravenous Hold 03/13/2020 1832)  clevidipine (CLEVIPREX) infusion 0.5 mg/mL (6 mg/hr Intravenous Rate/Dose Verify 03/03/2020 2100)  fentaNYL (SUBLIMAZE) injection 50 mcg (0 mcg Intravenous Hold 03/22/2020 1829)  fentaNYL 2536mg in NS 2568m(1063mml) infusion-PREMIX (0 mcg/hr Intravenous Hold 03/23/2020 1829)  fentaNYL (SUBLIMAZE) bolus via infusion 50 mcg (has no administration in time range)  propofol (DIPRIVAN) 1000 MG/100ML infusion (0 mcg/kg/min  120.7 kg Intravenous Hold 03/29/2020 1830)   stroke: mapping our early stages of recovery book (has no administration in time range)  acetaminophen (TYLENOL) tablet 650 mg (has no administration in time range)    Or  acetaminophen (TYLENOL) 160 MG/5ML solution 650 mg (has no administration in time range)    Or  acetaminophen (TYLENOL) suppository 650 mg (has no administration in time range)  senna-docusate (Senokot-S) tablet 1 tablet (has no administration in time range)  pantoprazole (PROTONIX) injection 40 mg (has no administration in time range)  chlorhexidine gluconate (MEDLINE KIT) (PERIDEX) 0.12 % solution 15 mL (has no administration in time range)  MEDLINE mouth rinse (has no administration in time range)  Chlorhexidine Gluconate Cloth 2 % PADS 6 each (has no administration in time range)  mannitol 20 % IVPB 100 g (0 g Intravenous Stopped 03/14/2020 1901)    ED Course  I have reviewed the triage vital signs and  the nursing notes.  Pertinent labs & imaging results that were available during my care of the patient were reviewed by me and considered in my medical decision making (see chart for details).    MDM Rules/Calculators/A&P  59 y/o male presenting via EMS who was initially called for HA and dizziness.   With EMS, pt neuro intact however upon arriving to the ED pt suddenly became nonresponsive. On my assessment pt able to minimally move lue and lle. He was otherwise unable to follow other commands or talk. Pupils were equal round and reactive to light at the time.  Code stroke was called, pt immediately taken to CT.   Reviewed CT, which showed large cerebellar bleed. labetalol and cleviprex ordered. En route back to pt room, he was noted to no longer be breathing and cpr was initiated. 1 round of epi given. Pulses found on next pulse check.   Dr. Tomi Bamberger intubated pt. Equal BS bilat.   Neuro at bedside recommending stat neurosurgery consult which was performed.   PA Costella with neurosurgery reviewed imaging and states that this is likely a nonsurvivable injury to the brain. Attending, Dr. Kathyrn Sheriff is in agreement.   Neurology, Dr. Lorraine Lax, to admit pt.   Final Clinical Impression(s) / ED Diagnoses Final diagnoses:  Nontraumatic intracerebral hemorrhage of cerebellum, unspecified laterality [facility])    Rx / DC Orders ED Discharge Orders    None       Bishop Dublin 03/11/2020 2321    Dorie Rank, MD 03/31/20 1459

## 2020-03-30 NOTE — Code Documentation (Signed)
CPR STARTED  °

## 2020-03-30 NOTE — ED Notes (Signed)
Chaplain paged @ [phone]-per Asher Muir, Geophysicist/field seismologist paged by Marylene Land

## 2020-03-30 NOTE — ED Provider Notes (Addendum)
Medical screening examination/treatment/procedure(s) were conducted as a shared visit with non-physician practitioner(s) and myself.  I personally evaluated the patient during the encounter.   Pt presented with dizziness.  During transport pt had sudden decline in mental status.  Proceeded to CT scan.  Unresponsive and CPR was performed immediately at the bedside   Physical Exam  BP (!) 215/101   Pulse 93   Ht 1.854 m (6\' 1" )   Wt 120.7 kg   SpO2 99%   BMI 35.09 kg/m   Physical Exam Unresponsive Cardiac irregular rhythm Palpable pulses now after cpr No signs of trauma ED Course/Procedures     .Critical Care Performed by: , MD Authorized by: Linwood Dibbles, MD   Critical care provider statement:    Critical care time (minutes):  45   Critical care was time spent personally by me on the following activities:  Discussions with consultants, evaluation of patient's response to treatment, examination of patient, ordering and performing treatments and interventions, ordering and review of laboratory studies, ordering and review of radiographic studies, pulse oximetry, re-evaluation of patient's condition, obtaining history from patient or surrogate and review of old charts Procedure Name: Intubation Date/Time: Apr 25, 2020 6:08 PM Performed by: 03/12/2020, MD Pre-anesthesia Checklist: Patient identified, Patient being monitored, Emergency Drugs available, Timeout performed and Suction available Oxygen Delivery Method: Non-rebreather mask Preoxygenation: Pre-oxygenation with 100% oxygen Induction Type: Rapid sequence Ventilation: Mask ventilation without difficulty Laryngoscope Size: Glidescope Grade View: Grade I Tube size: 7.5 mm Number of attempts: 1 Airway Equipment and Method: Video-laryngoscopy Placement Confirmation: ETT inserted through vocal cords under direct vision,  CO2 detector and Breath sounds checked- equal and bilateral Dental Injury: Teeth and Oropharynx as per  pre-operative assessment      EKG Atrial fibrillation rate 104 Left ventricular hypertrophy .  Diffuse ST depression No prior EKG for comparison  MDM  Pt was intubated at the bedside.  Head CT shows severe bleed.    Will proceed with HTN management, sedation.  Mannitol and cleveprex ordered.  Will assess whether there are possible intervention but prognosis is poor.       Linwood Dibbles, MD 2020-04-25 [phone]

## 2020-03-30 NOTE — Progress Notes (Signed)
CDS called to make initial referral, Ref # [id number]

## 2020-03-30 NOTE — Consult Note (Signed)
NAME:  Kristopher Garcia, MRN:  [id number], DOB:  [birth year], LOS: 0 ADMISSION DATE:  04/09/2020, CONSULTATION DATE:  04/09/2020 REFERRING MD:  Arther Dames, MD CHIEF COMPLAINT:   Comatose   Brief History   Patient presented with sudden onset severe headache, dizziness and vomiting, then suddenly became unresponsive requiring CPR and intubation.  CT head demonstrated very large cerebellar hematoma with compression of the pons, medulla, and midbrain and upward herniation through the incisura as well as tonsillar herniation.  Per neurosurgery, patient is without any significant neurological response and not surgical candidate, therefore recommended goals of care discussion with the family.   History of present illness   Kristopher Garcia is a 59 year old male with history of poorly controlled hypertension due to noncompliance, dyslipidemia, obesity, OSA and osteoarthritis who presented after sudden onset of severe headache, dizziness and vomiting after mowing the lawn at 1pm.  During transport, he had sudden decline in mental status, became unresponsive, CPR started and intubated. Emergent head CT revealed 4 x 6 cm acute hematoma left cerebellum with extension into the ventricles with mass-effect on the brainstem and obstructive mild hydrocephalus.  Neurosurgery found patient with minimal brainstem function, and their review of CT demonstrated very large cerebellar hematoma with compression of the pons, medulla, and midbrain and upward herniation through the incisura as well as tonsillar herniation.  He received Mannitol.  Per neurosurgery, with the patient's exam and CT findings they believe this to be a fatal hemorrhage and do not see a role for surgical evacuation as this would not improve chance of meaningful recovery. They recommended discussion with family regarding code status and change goals of care.  The patient is now DNR and there is ongoing goals of care discussion with the family by the  primary team.  Critical care consult was obtained for vent management. The patient was seen bedside.  He is comatose and unable to give history.  He is on mechanical ventilator, and appears comfortable.  He is on Clevidipine 73mcg/hour.  Current vent setting: PRVC Vt 620 RR 18 Peep 5 FiO2 50%   Past Medical History  1. Poorly controlled hypertension due to noncompliance 2. Dyslipidemia 3. Obesity 4. OSA  5. Osteoarthritis   Significant Hospital Events     Consults:  Neurosurgery Critical Care  Procedures:    Significant Diagnostic Tests:  CT head: 09-Apr-2020 IMPRESSION: 1. 4 x 6 cm acute hematoma left cerebellum with extension into the ventricles. There is mass-effect on the brainstem and obstructive mild hydrocephalus.  CXR: 04-09-2020 IMPRESSION: 1. Endotracheal tube tip just above the carina. 2. Hazy left parahilar opacities, possibly pneumonia.  AXR 04-09-20 IMPRESSION: Mild distension of the stomach with air. The OG tube terminates within the gastric body.  Micro Data:  none  Antimicrobials:  none  Interim history/subjective:  Comatose with no brainstem response  Objective   Blood pressure 138/73, pulse 65, temperature (!) 97.4 F (36.3 C), temperature source Axillary, resp. rate 18, height 6\' 1"  (1.854 m), weight 120.7 kg, SpO2 97 %.    Vent Mode: PRVC FiO2 (%):  [50 %-100 %] 50 % Set Rate:  [18 bmp] 18 bmp Vt Set:  [620 mL-630 mL] 620 mL PEEP:  [5 cmH20] 5 cmH20 Plateau Pressure:  [16 cmH20-18 cmH20] 16 cmH20   Intake/Output Summary (Last 24 hours) at 04-09-20 2246 Last data filed at 04-09-2020 2100 Gross per 24 hour  Intake 11.3 ml  Output --  Net 11.3 ml   2101  04/17/20 1800  Weight: 120.7 kg    Examination: General: acutely ill appearing male in comatose states on mechanical ventilator HENT:  No significant asymetry Lungs: clear to auscultation, no wheezing, rhonchi or rales Cardiovascular:  Normal S1S2, RRR Abdomen:  soft, no apparent distention, tenderness, + BS Extremities: no significant edema, erythema, pedal pulses intact Neuro:  Not on sedation but without any meaningful neurological response.  No withdrawal to pain/sternal rubs, no corneal reflect, pupil constricted without response to light, no gag/cough reflect with suctioning. GU: foley in place  Resolved Hospital Problem list     Assessment & Plan:    Acute Left Cerebellar Hemorrhage w/ IVH Comatose state due to large intracranial hemorrhage Large cerebellar hematoma with compression of the pons, medulla, and midbrain and upward herniation through the incisura as well as tonsillar herniation noted on CT head.  Patient without any brainstem reflected and comatose.  Per neurosurgery, believe this to be a fatal hemorrhage and do not see a role for surgical evacuation as this would not improve chance of meaningful recovery. They recommended discussion with family regarding code status and change goals of care.    Agree with continued goals of care discussion with family  Continue Clevidipine as per neurology to maintain systolic < 299 mmHg  Very poor prognosis   Cardiac arrest due to large intracranial hemorrhage Acute respiratory failure due to loss of brainstem reflect Patient basically with loss of central respiratory control due to brainstem herniation.  He is not triggering on the vent but appears comfortable.   Current vent setting as follows:  PRVC, Vt 620, RR 18, Peep 5, FiO2 50% His measured peak pressure is 23, dynamic compliance of 34 and total peep of 6.    Can maintain current vent settings  Would not obtain ABGs or make changes as would be medically futile given catastrophic cerebellar hematoma with brainstem herniation  Support ongoing family support and goals of care discussion   Best practice:  Diet: npo Pain/Anxiety/Delirium protocol (if indicated): none VAP protocol (if indicated): HOB > 30 deg, oral care DVT  prophylaxis: contraindicated due to [place] GI prophylaxis: pantoprazole Glucose control: keep < 200 Mobility:  bedrest Code Status: DNR Family Communication: family communicated with by primary team Disposition: to be determined  Labs   CBC: Recent Labs  Lab 04-17-2020 1737 04/17/20 1740  WBC 8.4  --   NEUTROABS 6.5  --   HGB 15.9 17.0  HCT 49.8 50.0  MCV 95.6  --   PLT 220  --     Basic Metabolic Panel: Recent Labs  Lab 2020/04/17 1737 04/17/2020 1740  NA 141 141  K 3.3* 3.2*  CL 103 102  CO2 24  --   GLUCOSE 197* 196*  BUN 26* 32*  CREATININE 1.38* 1.40*  CALCIUM 9.5  --    GFR: Estimated Creatinine Clearance: 77.3 mL/min (A) (by C-G formula based on SCr of 1.4 mg/dL (H)). Recent Labs  Lab 17-Apr-2020 1737  WBC 8.4    Liver Function Tests: Recent Labs  Lab 04/17/20 1737  AST 42*  ALT 25  ALKPHOS 56  BILITOT 0.7  PROT 7.7  ALBUMIN 4.5   No results for input(s): LIPASE, AMYLASE in the last 168 hours. No results for input(s): AMMONIA in the last 168 hours.  ABG    Component Value Date/Time   TCO2 27 04/17/2020 1740     Coagulation Profile: Recent Labs  Lab 17-Apr-2020 1737  INR 0.9    Cardiac Enzymes: No  results for input(s): CKTOTAL, CKMB, CKMBINDEX, TROPONINI in the last 168 hours.  HbA1C: Hemoglobin A1C  Date/Time Value Ref Range Status  05/04/2014 09:01 AM 5.3  Final   Hgb A1c MFr Bld  Date/Time Value Ref Range Status  10/08/2017 11:53 AM 5.7 (H) <5.7 % of total Hgb Final    Comment:    For someone without known diabetes, a hemoglobin  A1c value between 5.7% and 6.4% is consistent with prediabetes and should be confirmed with a  follow-up test. . For someone with known diabetes, a value <7% indicates that their diabetes is well controlled. A1c targets should be individualized based on duration of diabetes, age, comorbid conditions, and other considerations. . This assay result is consistent with an increased risk of  diabetes. . Currently, no consensus exists regarding use of hemoglobin A1c for diagnosis of diabetes for children. .     CBG: Recent Labs  Lab 04-07-2020 1929  GLUCAP 211*    Review of Systems:   Unobtainable due to comatose status.  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   Surgical History    Past Surgical History:  Procedure Laterality Date  . CARDIAC CATHETERIZATION  06/21/12  . LEFT HEART CATHETERIZATION WITH CORONARY ANGIOGRAM N/A 06/21/2012   Procedure: LEFT HEART CATHETERIZATION WITH CORONARY ANGIOGRAM;  Surgeon: Donato Schultz, MD;  Location: [facility] CATH LAB;  Service: Cardiovascular;  Laterality: N/A;  . NECK SURGERY    . ROTATOR CUFF REPAIR Right   . SPINE SURGERY       Social History   reports that he has never smoked. He has never used smokeless tobacco. He reports that he does not drink alcohol or use drugs.   Family History   His family history includes Cancer (age of onset: 59) in his mother; Diabetes in his mother; Heart disease in his father; Mental illness in his brother; Stroke in his mother.   Allergies Allergies  Allergen Reactions  . Bee Venom Swelling     Home Medications  Prior to Admission medications   Medication Sig Start Date End Date Taking? Authorizing Provider  amLODipine (NORVASC) 10 MG tablet Take 1 tablet (10 mg total) by mouth daily. 10/08/17   Porfirio Oar, PA  lisinopril (PRINIVIL,ZESTRIL) 10 MG tablet Take 1 tablet (10 mg total) by mouth daily. 02/11/18   Porfirio Oar, PA  lisinopril-hydrochlorothiazide (PRINZIDE,ZESTORETIC) 20-25 MG tablet Take 1 tablet by mouth daily. Patient not taking: Reported on 09/14/2019 02/11/18   Porfirio Oar, PA     Critical care time: 35 minutes    ___________________ Drue Stager. Coralie Common, MD [facility]

## 2020-03-30 NOTE — ED Notes (Signed)
[facility]/Code Stroke Activated-1735-per [place], [place] called by Marylene Land

## 2020-03-31 ENCOUNTER — Other Ambulatory Visit (HOSPITAL_COMMUNITY): Payer: BC Managed Care – PPO

## 2020-03-31 DIAGNOSIS — I616 Nontraumatic intracerebral hemorrhage, multiple localized: Secondary | ICD-10-CM

## 2020-03-31 LAB — POCT I-STAT 7, (LYTES, BLD GAS, ICA,H+H)
Acid-Base Excess: 5 mmol/L — ABNORMAL HIGH (ref 0.0–2.0)
Acid-Base Excess: 7 mmol/L — ABNORMAL HIGH (ref 0.0–2.0)
Acid-Base Excess: 7 mmol/L — ABNORMAL HIGH (ref 0.0–2.0)
Bicarbonate: 26.7 mmol/L (ref 20.0–28.0)
Bicarbonate: 28.5 mmol/L — ABNORMAL HIGH (ref 20.0–28.0)
Bicarbonate: 31.6 mmol/L — ABNORMAL HIGH (ref 20.0–28.0)
Calcium, Ion: 1.24 mmol/L (ref 1.15–1.40)
Calcium, Ion: 1.2 mmol/L (ref 1.15–1.40)
Calcium, Ion: 1.24 mmol/L (ref 1.15–1.40)
HCT: 45 % (ref 39.0–52.0)
HCT: 45 % (ref 39.0–52.0)
HCT: 46 % (ref 39.0–52.0)
Hemoglobin: 15.3 g/dL (ref 13.0–17.0)
Hemoglobin: 15.3 g/dL (ref 13.0–17.0)
Hemoglobin: 15.6 g/dL (ref 13.0–17.0)
O2 Saturation: 96 %
O2 Saturation: 94 %
O2 Saturation: 96 %
Patient temperature: 98.1
Patient temperature: 96.8
Patient temperature: 99.1
Potassium: 2.9 mmol/L — ABNORMAL LOW (ref 3.5–5.1)
Potassium: 3.4 mmol/L — ABNORMAL LOW (ref 3.5–5.1)
Potassium: 3.2 mmol/L — ABNORMAL LOW (ref 3.5–5.1)
Sodium: 139 mmol/L (ref 135–145)
Sodium: 142 mmol/L (ref 135–145)
Sodium: 142 mmol/L (ref 135–145)
TCO2: 28 mmol/L (ref 22–32)
TCO2: 29 mmol/L (ref 22–32)
TCO2: 33 mmol/L — ABNORMAL HIGH (ref 22–32)
pCO2 arterial: 30.4 mmHg — ABNORMAL LOW (ref 32.0–48.0)
pCO2 arterial: 27.9 mmHg — ABNORMAL LOW (ref 32.0–48.0)
pCO2 arterial: 43.9 mmHg (ref 32.0–48.0)
pH, Arterial: 7.55 — ABNORMAL HIGH (ref 7.350–7.450)
pH, Arterial: 7.613 (ref 7.350–7.450)
pH, Arterial: 7.467 — ABNORMAL HIGH (ref 7.350–7.450)
pO2, Arterial: 71 mmHg — ABNORMAL LOW (ref 83.0–108.0)
pO2, Arterial: 54 mmHg — ABNORMAL LOW (ref 83.0–108.0)
pO2, Arterial: 76 mmHg — ABNORMAL LOW (ref 83.0–108.0)

## 2020-03-31 LAB — PHOSPHORUS: Phosphorus: 3.2 mg/dL (ref 2.5–4.6)

## 2020-03-31 LAB — CBC
HCT: 37.9 % — ABNORMAL LOW (ref 39.0–52.0)
Hemoglobin: 12.5 g/dL — ABNORMAL LOW (ref 13.0–17.0)
MCH: 30.8 pg (ref 26.0–34.0)
MCHC: 33 g/dL (ref 30.0–36.0)
MCV: 93.3 fL (ref 80.0–100.0)
Platelets: 286 10*3/uL (ref 150–400)
RBC: 4.06 MIL/uL — ABNORMAL LOW (ref 4.22–5.81)
RDW: 14.5 % (ref 11.5–15.5)
WBC: 10.1 10*3/uL (ref 4.0–10.5)
nRBC: 0 % (ref 0.0–0.2)

## 2020-03-31 LAB — COMPREHENSIVE METABOLIC PANEL
ALT: 96 U/L — ABNORMAL HIGH (ref 0–44)
AST: 78 U/L — ABNORMAL HIGH (ref 15–41)
Albumin: 3.7 g/dL (ref 3.5–5.0)
Alkaline Phosphatase: 55 U/L (ref 38–126)
Anion gap: 13 (ref 5–15)
BUN: 30 mg/dL — ABNORMAL HIGH (ref 6–20)
CO2: 29 mmol/L (ref 22–32)
Calcium: 9.5 mg/dL (ref 8.9–10.3)
Chloride: 102 mmol/L (ref 98–111)
Creatinine, Ser: 1.94 mg/dL — ABNORMAL HIGH (ref 0.61–1.24)
GFR calc Af Amer: 43 mL/min — ABNORMAL LOW (ref >60)
GFR calc non Af Amer: 37 mL/min — ABNORMAL LOW (ref >60)
Glucose, Bld: 136 mg/dL — ABNORMAL HIGH (ref 70–99)
Potassium: 3.3 mmol/L — ABNORMAL LOW (ref 3.5–5.1)
Sodium: 144 mmol/L (ref 135–145)
Total Bilirubin: 1.1 mg/dL (ref 0.3–1.2)
Total Protein: 7 g/dL (ref 6.5–8.1)

## 2020-03-31 LAB — ABO/RH: ABO/RH(D): O POS

## 2020-03-31 LAB — URINALYSIS, COMPLETE (UACMP) WITH MICROSCOPIC
Bacteria, UA: NONE SEEN
Bilirubin Urine: NEGATIVE
Glucose, UA: NEGATIVE mg/dL
Ketones, ur: NEGATIVE mg/dL
Leukocytes,Ua: NEGATIVE
Nitrite: NEGATIVE
Protein, ur: 100 mg/dL — AB
RBC / HPF: >50 RBC/hpf — ABNORMAL HIGH (ref 0–5)
Specific Gravity, Urine: 1.023 (ref 1.005–1.030)
pH: 6 (ref 5.0–8.0)

## 2020-03-31 LAB — DIFFERENTIAL
Abs Immature Granulocytes: 0.03 10*3/uL (ref 0.00–0.07)
Basophils Absolute: 0 10*3/uL (ref 0.0–0.1)
Basophils Relative: 0 %
Eosinophils Absolute: 0 10*3/uL (ref 0.0–0.5)
Eosinophils Relative: 0 %
Immature Granulocytes: 0 %
Lymphocytes Relative: 7 %
Lymphs Abs: 0.7 10*3/uL (ref 0.7–4.0)
Monocytes Absolute: 0.6 10*3/uL (ref 0.1–1.0)
Monocytes Relative: 6 %
Neutro Abs: 8.7 10*3/uL — ABNORMAL HIGH (ref 1.7–7.7)
Neutrophils Relative %: 87 %

## 2020-03-31 LAB — SARS CORONAVIRUS 2 BY RT PCR (HOSPITAL ORDER, PERFORMED IN ~~LOC~~ HOSPITAL LAB): SARS Coronavirus 2: NEGATIVE

## 2020-03-31 LAB — BILIRUBIN, DIRECT: Bilirubin, Direct: 0.1 mg/dL (ref 0.0–0.2)

## 2020-03-31 LAB — MAGNESIUM: Magnesium: 2.4 mg/dL (ref 1.7–2.4)

## 2020-03-31 MED ORDER — LACTATED RINGERS IV SOLN: INTRAVENOUS | Status: DC

## 2020-03-31 MED ORDER — NOREPINEPHRINE 4 MG/250ML-% IV SOLN
0.0000 ug/min | INTRAVENOUS | Status: DC
  Administered 2020-03-31: 2 ug/min via INTRAVENOUS

## 2020-03-31 MED ORDER — CIPROFLOXACIN IN D5W 400 MG/200ML IV SOLN
400.0000 mg | Freq: Once | INTRAVENOUS | Status: AC
  Administered 2020-04-01: 400 mg via INTRAVENOUS
  Filled 2020-03-31: qty 200

## 2020-03-31 MED ORDER — POTASSIUM CHLORIDE 10 MEQ/100ML IV SOLN
10.0000 meq | INTRAVENOUS | Status: AC
  Administered 2020-03-31 – 2020-04-01 (×4): 10 meq via INTRAVENOUS
  Filled 2020-03-31 (×4): qty 100

## 2020-03-31 MED ORDER — SODIUM CHLORIDE 0.9 % IV SOLN
1000.0000 mg | Freq: Once | INTRAVENOUS | Status: AC
  Administered 2020-03-31: 1000 mg via INTRAVENOUS
  Filled 2020-03-31: qty 8

## 2020-03-31 MED ORDER — POTASSIUM CHLORIDE 10 MEQ/100ML IV SOLN
10.0000 meq | INTRAVENOUS | Status: AC
  Administered 2020-03-31 (×3): 10 meq via INTRAVENOUS
  Filled 2020-03-31 (×3): qty 100

## 2020-03-31 MED FILL — Medication: Qty: 1 | Status: AC

## 2020-03-31 NOTE — Progress Notes (Signed)
Attempted apnea test  Possible breath along with her vomiting noted  Process lasted about 2 and a half minutes and patient was reconnected to the ventilator  Will suction stomach  Allow him to stabilize and attempt apnea test after about an hour  Check repeat arterial blood gas, initial arterial blood gas did reveal alkalemia  Ventilator changes made

## 2020-03-31 NOTE — Discharge Summary (Addendum)
Death Summary  Patient ID: Kristopher Garcia   MRN: [id number]     DOB: 1961-02-08  Date of Admission: 04/08/20 Date of Death:  04-10-20  Attending Physician:  Naomie Dean, Stroke MD Consultant(s):    CCM - Salome Holmes, MD  ;  Neuro Surgery  - Lisbeth Renshaw, MD  Patient's PCP:  [facility]  DIAGNOSIS:   ICH: L cerebellum ICH w/ IVH, cerebral edema and tonsillar and upward herniation w/ obstructive hydrocephalus, hemorrhage secondary to hypertensive emergency    Acute Respiratory Failure   Hyperglycemia  Possible aspiration pneumonia   Hypokalemia   CKD - stage 2   Cytotoxic edema   Obesity  Hypertension  Hyperlipidemia  LABORATORY STUDIES CBC    Component Value Date/Time   WBC 9.3 03/31/2020 2326   RBC 4.65 03/31/2020 2326   HGB 14.6 2020-04-10 0320   HCT 43.0 04/10/2020 0320   PLT 210 03/31/2020 2326   MCV 95.5 03/31/2020 2326   MCH 30.8 03/31/2020 2326   MCHC 32.2 03/31/2020 2326   RDW 15.2 03/31/2020 2326   LYMPHSABS 0.7 03/31/2020 1508   MONOABS 0.6 03/31/2020 1508   EOSABS 0.0 03/31/2020 1508   BASOSABS 0.0 03/31/2020 1508   CMP    Component Value Date/Time   NA 144 Apr 10, 2020 1606   K 4.0 April 10, 2020 1606   CL 103 04/10/20 1606   CO2 27 04-10-2020 1606   GLUCOSE 167 (H) 10-Apr-2020 1606   BUN 54 (H) April 10, 2020 1606   CREATININE 4.30 (H) Apr 10, 2020 1606   CREATININE 1.37 (H) 10/08/2017 1153   CALCIUM 8.6 (L) 04-10-20 1606   PROT 6.0 (L) April 10, 2020 1606   ALBUMIN 3.0 (L) April 10, 2020 1606   AST 37 2020/04/10 1606   ALT 66 (H) 2020/04/10 1606   ALKPHOS 42 04-10-2020 1606   BILITOT 0.8 04/10/20 1606   GFRNONAA 14 (L) Apr 10, 2020 1606   GFRNONAA 75 10/01/2014 1052   GFRAA 16 (L) 04-10-20 1606   GFRAA 87 10/01/2014 1052   COAGS Lab Results  Component Value Date   INR 0.9 08-Apr-2020    SIGNIFICANT DIAGNOSTIC STUDIES CT ABDOMEN PELVIS WO CONTRAST  Result Date: 03/31/2020 CLINICAL  DATA:  Cardiac transplant donor assessment. EXAM: CT CHEST, ABDOMEN AND PELVIS WITHOUT CONTRAST TECHNIQUE: Multidetector CT imaging of the chest, abdomen and pelvis was performed following the standard protocol without IV contrast. COMPARISON:  Chest radiograph yesterday. FINDINGS: CT CHEST FINDINGS Cardiovascular: Upper normal heart size, cardiac chambers difficult to delineate on noncontrast exam. No pericardial effusion. No visualized coronary artery calcifications. Normal caliber pulmonary trunk with main pulmonary artery measuring 3.1 cm. The thoracic aorta is normal in caliber. There is common origin of the brachiocephalic and left common carotid artery, variant arch anatomy. Aortic measurements as follows: Sinotubular junction 3 cm, mid ascending 3.2 cm, distal ascending 3.1 cm. Transverse aorta just distal to the left subclavian takeoff 3 cm. Proximal descending aorta 3.2 cm. Mid descending aorta 2.8 cm. Distal descending aorta at the diaphragmatic hiatus 2.7 cm. Trace aortic atherosclerosis. Mediastinum/Nodes: Limited assessment for adenopathy given lack contrast on breathing motion. No bulky mediastinal lymph nodes. Enteric tube tip below the clavicles. Enteric tube decompresses the esophagus, however there is intraluminal esophageal fluid. No wall thickening. Lungs/Pleura: Dependent consolidations within both lower lobes with air bronchograms. No pulmonary edema. No evidence of pulmonary mass. Possible small pleural effusions. No significant debris in the trachea or bronchial tree. Musculoskeletal: Surgical hardware in the cervical spine with  corpectomy. Exaggerated thoracic kyphosis with multilevel degenerative change. Prominent degenerative change of the right shoulder. No acute osseous abnormalities are seen. CT ABDOMEN PELVIS FINDINGS Hepatobiliary: No focal hepatic lesion. Equivocal hepatic steatosis, density difficult to accurately measure due to streak artifact from arms down positioning.  Gallbladder is unremarkable without calcified gallstone. Pancreas: Unremarkable noncontrast appearance. No ductal dilatation or inflammation. Spleen: Normal in size without focal abnormality. Adrenals/Urinary Tract: Normal right adrenal gland. Mild left adrenal thickening without dominant nodule. There is mild bilateral hydroureteronephrosis. No renal or ureteral calculi. No obvious renal mass. Urinary bladder is distended which may be a cause of ureteral distension. No bladder wall thickening. Stomach/Bowel: Enteric tube tip in the stomach, stomach is prominently distended with fluid. Dependent densities within the stomach may be swallowed foreign bodies such as teeth. No small bowel obstruction or abnormal distention. Scattered colonic diverticula without diverticulitis. Vascular/Lymphatic: The abdominal aorta is normal in caliber. Mild distal atherosclerosis. No periaortic stranding. No bulky abdominopelvic adenopathy. Reproductive: Prostate is unremarkable. Other: No ascites or free air. Musculoskeletal: Diffuse degenerative change throughout the spine. Degenerative change of both hips. No focal bone lesion. IMPRESSION: 1. Dependent consolidations within both lower lobes with air bronchograms, suspicious for pneumonia, including aspiration. Possible small pleural effusions. 2. Enteric tube in place with tip in the stomach, however there is fluid distending the stomach and esophagus. 3. Mild bilateral hydroureteronephrosis, likely related to distended urinary bladder. 4. Dependent densities within the stomach may be swallowed foreign bodies such as teeth or other ingested material. Colonic diverticulosis without diverticulitis. 5. Minor thoracic and distal abdominal aortic atherosclerosis without aneurysm. Aortic Atherosclerosis (ICD10-I70.0). Electronically Signed   By: Narda Rutherford M.D.   On: 03/31/2020 15:49   DG Abd 1 View  Result Date: 03/27/2020 CLINICAL DATA:  Evaluate OG tube placement EXAM:  ABDOMEN - 1 VIEW COMPARISON:  None. FINDINGS: Stomach is somewhat distended with air. The OG tube terminates in the region the gastric body. IMPRESSION: Mild distension of the stomach with air. The OG tube terminates within the gastric body. Electronically Signed   By: Gerome Sam III M.D   On: 03/07/2020 21:50   CT CHEST WO CONTRAST  Result Date: 03/31/2020 CLINICAL DATA:  Cardiac transplant donor assessment. EXAM: CT CHEST, ABDOMEN AND PELVIS WITHOUT CONTRAST TECHNIQUE: Multidetector CT imaging of the chest, abdomen and pelvis was performed following the standard protocol without IV contrast. COMPARISON:  Chest radiograph yesterday. FINDINGS: CT CHEST FINDINGS Cardiovascular: Upper normal heart size, cardiac chambers difficult to delineate on noncontrast exam. No pericardial effusion. No visualized coronary artery calcifications. Normal caliber pulmonary trunk with main pulmonary artery measuring 3.1 cm. The thoracic aorta is normal in caliber. There is common origin of the brachiocephalic and left common carotid artery, variant arch anatomy. Aortic measurements as follows: Sinotubular junction 3 cm, mid ascending 3.2 cm, distal ascending 3.1 cm. Transverse aorta just distal to the left subclavian takeoff 3 cm. Proximal descending aorta 3.2 cm. Mid descending aorta 2.8 cm. Distal descending aorta at the diaphragmatic hiatus 2.7 cm. Trace aortic atherosclerosis. Mediastinum/Nodes: Limited assessment for adenopathy given lack contrast on breathing motion. No bulky mediastinal lymph nodes. Enteric tube tip below the clavicles. Enteric tube decompresses the esophagus, however there is intraluminal esophageal fluid. No wall thickening. Lungs/Pleura: Dependent consolidations within both lower lobes with air bronchograms. No pulmonary edema. No evidence of pulmonary mass. Possible small pleural effusions. No significant debris in the trachea or bronchial tree. Musculoskeletal: Surgical hardware in the cervical  spine with corpectomy. Exaggerated  thoracic kyphosis with multilevel degenerative change. Prominent degenerative change of the right shoulder. No acute osseous abnormalities are seen. CT ABDOMEN PELVIS FINDINGS Hepatobiliary: No focal hepatic lesion. Equivocal hepatic steatosis, density difficult to accurately measure due to streak artifact from arms down positioning. Gallbladder is unremarkable without calcified gallstone. Pancreas: Unremarkable noncontrast appearance. No ductal dilatation or inflammation. Spleen: Normal in size without focal abnormality. Adrenals/Urinary Tract: Normal right adrenal gland. Mild left adrenal thickening without dominant nodule. There is mild bilateral hydroureteronephrosis. No renal or ureteral calculi. No obvious renal mass. Urinary bladder is distended which may be a cause of ureteral distension. No bladder wall thickening. Stomach/Bowel: Enteric tube tip in the stomach, stomach is prominently distended with fluid. Dependent densities within the stomach may be swallowed foreign bodies such as teeth. No small bowel obstruction or abnormal distention. Scattered colonic diverticula without diverticulitis. Vascular/Lymphatic: The abdominal aorta is normal in caliber. Mild distal atherosclerosis. No periaortic stranding. No bulky abdominopelvic adenopathy. Reproductive: Prostate is unremarkable. Other: No ascites or free air. Musculoskeletal: Diffuse degenerative change throughout the spine. Degenerative change of both hips. No focal bone lesion. IMPRESSION: 1. Dependent consolidations within both lower lobes with air bronchograms, suspicious for pneumonia, including aspiration. Possible small pleural effusions. 2. Enteric tube in place with tip in the stomach, however there is fluid distending the stomach and esophagus. 3. Mild bilateral hydroureteronephrosis, likely related to distended urinary bladder. 4. Dependent densities within the stomach may be swallowed foreign bodies such as  teeth or other ingested material. Colonic diverticulosis without diverticulitis. 5. Minor thoracic and distal abdominal aortic atherosclerosis without aneurysm. Aortic Atherosclerosis (ICD10-I70.0). Electronically Signed   By: Keith Rake M.D.   On: 03/31/2020 15:49   DG Chest Portable 1 View  Result Date: 2020-04-28 CLINICAL DATA:  Intubation EXAM: PORTABLE CHEST 1 VIEW COMPARISON:  None. FINDINGS: Mild cardiomegaly. Hazy left parahilar opacities. The endotracheal tube tip is just above the carina. Enteric tube tip and side port are beyond the field of view. IMPRESSION: 1. Endotracheal tube tip just above the carina. 2. Hazy left parahilar opacities, possibly pneumonia. Electronically Signed   By: Ulyses Jarred M.D.   On: Apr 28, 2020 22:55   CT HEAD CODE STROKE WO CONTRAST  Result Date: April 28, 2020 CLINICAL DATA:  Code stroke.  Acute neuro deficit.  Dizziness. EXAM: CT HEAD WITHOUT CONTRAST TECHNIQUE: Contiguous axial images were obtained from the base of the skull through the vertex without intravenous contrast. COMPARISON:  None. FINDINGS: Brain: Large area of acute hemorrhage in the left cerebellum measuring 4 x 6 cm. There is extension into the fourth ventricle and spread of blood into the third and lateral ventricles. There is mild obstructive hydrocephalus. There is mass-effect on the brainstem due to the hematoma. No acute ischemic infarct.  No mass lesion identified. Vascular: Negative for hyperdense vessel Skull: Negative Sinuses/Orbits: None Other: ASPECTS ([facility] Stroke Program Early CT Score) -not applicable due to hemorrhage. IMPRESSION: 1. 4 x 6 cm acute hematoma left cerebellum with extension into the ventricles. There is mass-effect on the brainstem and obstructive mild hydrocephalus. These results were called by telephone at the time of interpretation on 04-28-2020 at 5:51 pm to provider Aroor, who verbally acknowledged these results. Electronically Signed   By: Franchot Gallo M.D.   On:  04/28/20 17:51       HISTORY OF PRESENT ILLNESS Kristopher Garcia is a 59 y.o. male with PMH with uncontrolled HTN, obesity, OSA presented with sudden onsetvomiting andheadache. Brought by EMS but not code stroke  as apparently patient did not have any neurological deficits. BP >240 systolic.  LKW was 4:20 pm on Apr 09, 2020. He had sudden change in the ED at 5:15 pm.On assessment by EDP- noted to have right side flaccid weakness - immediately taken to CT scan and code stroke activated. CT head showed massive left cerebellar hemorrhage with IVH with mass effect on the  brainstem. During assessment in CT scanner, patient comatose, pupils fixed and dilated. Taken immediately back to ER for emergent airway intubation. Pulse not felt and CPR initiated by ED team. Stat [place] and recommended emergent NS consult. NIHSS: 32. ICH score4 (2 points for GCS, 1 for IVH, 1 pt infratentorial hemorrhage)  HOSPITAL COURSE Mr. BREYDON SENTERS is a 59 y.o. male with history of uncontrolled HTN, obesity, OSA presented with sudden onsetvomiting andheadache SBP >240 -> right side flaccid weakness -> patient comatose, pupils fixed and dilated -> asystole -> CPR -> intubated. NS consult - Dr Conchita Paris - "I believe this to be a fatal hemorrhage. I do not see a role for surgical evacuation as this would not improve chance of meaningful recovery. Would recommend discussion with family regarding code status and change goals of care."  ICH: L cerebellum ICH w/ IVH, cerebral edema and tonsillar and upward herniation w/ obstructive hydrocephalus, hemorrhage secondary to hypertensive emergency  Code Stroke CT Head - 4 x 6 cm acute hematoma left cerebellum with extension into the ventricles. There is mass-effect on the brainstem and obstructive mild hydrocephalus.     Loyal Jacobson Virus 2 - negative  No antithrombotic prior to admission, now on No antithrombotic given ICH  Poor neuro prognosis. NS (Nundkumar) believes fatal  hemorrhage. No role for surgery.   No brainstem reflexes per neuro exam but Apnea test remains positive  Comfort care. DNR  Family elected to pursue organ donation after cardiac death. Taken to OR.  Date/time of death:  2020/04/11 at 26-Feb-2006. Taken to OR for organ harvesting.   Respiratory Failure  Secondary to stroke  Intubated in ED with neuro decline  CCM consulted   Hypertensive Emergency Hypotension  Home BP meds: Norvasc ; lisinopril ; HCTZ  Noncompliant with meds  Treated with Cleviprex / Labetalol / norepi in hospital  Dysphagia  Secondary to stroke  NPO   Other Stroke Risk Factors  Obesity, Body mass index is 35.09 kg/m.  Family hx stroke (mother)  Obstructive sleep apnea  Other Active Problems  Hyperglycemia  Possible aspiration pneumonia by CXR   Hypokalemia   CKD - stage 2   Cytotoxic cerebral edema   Osteoarthritis   DEATH  Date/time of death:  2020-04-11 at 2006-02-[birth year]  Taken to OR for organ harvesting  Annie Main, MSN, APRN, ANVP-BC, AGPCNP-BC Advanced Practice Stroke Nurse [facility] See Amion for Schedule & Pager information 04/02/2020 3:44 PM

## 2020-03-31 NOTE — Progress Notes (Addendum)
Apnea test attempted  Patient was having shallow breathing efforts noted by myself, respiratory therapist  Apnea test was stopped after about 1 minute  Preapnea test ABG 7.46/44/76

## 2020-03-31 NOTE — Progress Notes (Signed)
Patient transported to and from CT with no issues.

## 2020-03-31 NOTE — Progress Notes (Signed)
Updated spouse about apnea test  We will continue aggressive support  She is willing to continue talking with organ donor services to help other people

## 2020-03-31 NOTE — Procedures (Signed)
Arterial Catheter Insertion Procedure Note Kristopher Garcia [id number] 1960/12/02  Procedure: Insertion of Arterial Catheter  Indications: Frequent blood sampling  Procedure Details Consent: Unable to obtain consent because of altered level of consciousness.medical necessity Time Out: Verified patient identification, verified procedure, site/side was marked, verified correct patient position, special equipment/implants available, medications/allergies/relevent history reviewed, required imaging and test results available.  Performed  Maximum sterile technique was used including antiseptics, cap, gloves, gown, hand hygiene, mask and sheet. Skin prep: Chlorhexidine; local anesthetic administered 22 gauge catheter was inserted into right radial artery using the Seldinger technique. ULTRASOUND GUIDANCE USED: YES Evaluation Blood flow good; BP tracing good. Complications: No apparent complications.   Kristopher Garcia 03/31/2020

## 2020-03-31 NOTE — Progress Notes (Signed)
Apnea test attempted with Dr. Val Eagle at bedside. 8L O2 provided down ETT started at 1402. Possible breath initiated along with vomiting. Patient placed back on ventilator at 1404 to prevent aspiration. RT will continue to monitor.

## 2020-03-31 NOTE — Progress Notes (Signed)
Apnea test started at 1705 with MD and RN at bedside. Spontaneous breaths noted in less than one minute. Patient placed back on ventilator. RT will continue to monitor.

## 2020-03-31 NOTE — Progress Notes (Signed)
STROKE TEAM PROGRESS NOTE   HISTORY OF PRESENT ILLNESS (per record) Kristopher Garcia is a 59 y.o. male with PMH with uncontrolled HTN, obesity, OSA presented with sudden onset vomiting and headache. Brought by EMS but not code stroke as apparently patient did not have any neurological deficits. BP >240 systolic.LKN was 4.20 pm Sudden change at bridge at 5.15 pm. On assessment by EDP- noted have right side flaccid weakness - immediately taken to CT scan and code stroke activated.  CT head showed massive left cerebellar hemorrhage with IVH and mass effect on brainstem.  On assessment in CT scanner, patient comatose, pupils fixed and dilated. Taken immediately back to ER for emergent airway intubation. Pulse not felt and CPR initiated by ED team.  Stat Mannitol and recommended emergent NS consult.  Date last known well: April 04, 2020 Time last known well: 4.20 pm tPA Given: no, hemorrhage  NIHSS: 32 Baseline MRS ICH score 4 (:2 points for GCS,  1 for IVH, 1 pt infratentorial hemorrhage. )   INTERVAL HISTORY His cousin and wife are at the bedside.  We had a long discussion about patient's current status, the severity of his stroke, poor prognosis.  I answered all questions.  Spent an extended amount of time at the bedside with family.  We also discussed organ donation.  We will contact organ donation to come and discuss with family.    OBJECTIVE Vitals:   03/31/20 0700 03/31/20 0715 03/31/20 0752 03/31/20 0800  BP: 137/76 139/83  129/80  Pulse: 61 62  62  Resp: 18 18  18   Temp:      TempSrc:      SpO2: 98% 98% 98% 97%  Weight:      Height:        CBC:  Recent Labs  Lab 04/04/2020 1737 April 04, 2020 1737 2020/04/04 1740 03/31/20 0244  WBC 8.4  --   --   --   NEUTROABS 6.5  --   --   --   HGB 15.9   < > 17.0 15.3  HCT 49.8   < > 50.0 45.0  MCV 95.6  --   --   --   PLT 220  --   --   --    < > = values in this interval not displayed.    Basic Metabolic Panel:  Recent Labs  Lab  April 04, 2020 1737 04/04/20 1737 04/04/20 1740 03/31/20 0244  NA 141   < > 141 139  K 3.3*   < > 3.2* 2.9*  CL 103  --  102  --   CO2 24  --   --   --   GLUCOSE 197*  --  196*  --   BUN 26*  --  32*  --   CREATININE 1.38*  --  1.40*  --   CALCIUM 9.5  --   --   --    < > = values in this interval not displayed.    Lipid Panel:     Component Value Date/Time   CHOL 180 10/08/2017 1153   TRIG 43 04-04-2020 1823   HDL 68 10/08/2017 1153   CHOLHDL 2.6 10/08/2017 1153   VLDL 12 10/01/2014 1052   LDLCALC 98 10/08/2017 1153   HgbA1c:  Lab Results  Component Value Date   HGBA1C 5.7 (H) 10/08/2017   Urine Drug Screen: No results found for: LABOPIA, COCAINSCRNUR, LABBENZ, AMPHETMU, THCU, LABBARB  Alcohol Level No results found for: ETH  IMAGING  DG Abd 1  View 04-27-2020 IMPRESSION:  Mild distension of the stomach with air. The OG tube terminates within the gastric body.   DG Chest Portable 1 View 2020-04-27 IMPRESSION:  1. Endotracheal tube tip just above the carina.  2. Hazy left parahilar opacities, possibly pneumonia.   CT HEAD CODE STROKE WO CONTRAST 04-27-20 IMPRESSION:  4 x 6 cm acute hematoma left cerebellum with extension into the ventricles. There is mass-effect on the brainstem and obstructive mild hydrocephalus.    ECG - SB rate 58 BPM. (See cardiology reading for complete details)   PHYSICAL EXAM Blood pressure 129/80, pulse 62, temperature 98.1 F (36.7 C), temperature source Oral, resp. rate 18, height 6\' 1"  (1.854 m), weight 120.7 kg, SpO2 97 %.   Middle-aged African-American man, no acute distress, intubated, not sedated, does not open eyes to voice or sternal rub, not following commands, pupils pinpoint and nonreactive, no oculocephalic reflex, corneals absent, facial symmetry could not ascertain due to ET tube, not blinking to threat, not tracking examiner, eyes are conjugate and midline, flaccid x4, no response to withdrawal, no gag or  cough.    ASSESSMENT/PLAN Kristopher Garcia is a 59 y.o. male with history of uncontrolled HTN, obesity, OSA presented with sudden onset vomiting and headache SBP >240 -> right side flaccid weakness -> patient comatose, pupils fixed and dilated -> asystole -> CPR -> intubated. He did not receive IV t-PA due to ICH. NS consult - Dr 46 - "I believe this to be a fatal hemorrhage. I do not see a role for surgical evacuation as this would not improve chance of meaningful recovery. Would recommend discussion with family regarding code status and change goals of care."  ICH: 4 x 6 cm acute hematoma left cerebellum with extension into the ventricle secondary to uncontrolled hypertension.  Not survivable.  Code Stroke CT Head - 4 x 6 cm acute hematoma left cerebellum with extension into the ventricles. There is mass-effect on the brainstem and obstructive mild hydrocephalus.     CT head - not ordered  MRI head - not ordered  MRA head - not ordered  CTA H&N - not ordered  CT Perfusion - not ordered  Carotid Doppler - pending  2D Echo - pending  Sars Corona Virus 2 - negative  LDL - not ordered  HgbA1c - not ordered  UDS - not ordered  VTE prophylaxis - SCDs Diet  Diet Order            Diet NPO time specified  Diet effective now              No antithrombotic prior to admission, now on No antithrombotic ICH  Patient counseled to be compliant with his antithrombotic medications  Ongoing aggressive stroke risk factor management  Therapy recommendations:  pending  Disposition:  Pending  Hypertension  Home BP meds: Norvasc ; lisinopril ; HCTZ  Current BP meds: Cleviprex / Labetalol prn  Stable  SBP goal < 140 initially . Long-term BP goal normotensive  Hyperlipidemia  Home Lipid lowering medication: none   LDL - not ordered, goal < 70  Current lipid lowering medication: none  Continue statin at discharge  Other Stroke Risk Factors  Obesity, Body  mass index is 35.09 kg/m., recommend weight loss, diet and exercise as appropriate   Family hx stroke (mother)  Obstructive sleep apnea  Other Active Problems  Code status - DNR  Hyperglycemia  Possible aspiration pneumonia by CXR (WBCs - 8.4 ; temp 98.1)  Hypokalemia -  3.3->3.2->2.9 -> supplement  CKD - stage 2 - creatinine - 1.40  Cytotoxic edema -> mannitol   Hospital day # 1  Family agreed to discuss organ donation and make patient comfort care with terminal extubation planned pending organ donation status.  Personally examined patient and images, and have participated in and made any corrections needed to history, physical, neuro exam,assessment and plan as stated above.  I have personally obtained the history, evaluated lab date, reviewed imaging studies and agree with radiology interpretations.    Sarina Ill, MD Stroke Neurology   A total of 25 minutes was spent for the care of this patient, spent on counseling patient and family on different diagnostic and therapeutic options, counseling and coordination of care, riskd ans benefits of management, compliance, or risk factor reduction and education.  This patient is critically ill and at significant risk of neurological worsening, death and care requires constant monitoring of vital signs, hemodynamics,respiratory and cardiac monitoring,review of multiple databases, neurological assessment, discussion with family, other specialists and medical decision making of high complexity.I  I spent 60 minutes of neurocritical care time in the care of this patient.  Sarina Ill, MD [facility]   To contact Stroke Continuity provider, please refer to [email or web address] After hours, contact General Neurology

## 2020-03-31 NOTE — Progress Notes (Signed)
NAME:  Kristopher Garcia, Kristopher Garcia, Kristopher Garcia, Kristopher Garcia, Kristopher Garcia REFERRING MD:  Aroor, CHIEF COMPLAINT:  comatose   Brief History   Patient presented with sudden onset severe headache, dizziness and vomiting, then suddenly became unresponsive requiring CPR and intubation.  CT head demonstrated very large cerebellar hematoma with compression of the pons, medulla, and midbrain and upward herniation through the incisura as well as tonsillar herniation.  Per neurosurgery, patient is without any significant neurological response and not surgical candidate, therefore recommended goals of care discussion with the family.   History of present illness   Mr. Kristopher Garcia is a 59 year old male with history of poorly controlled hypertension due to noncompliance, dyslipidemia, obesity, OSA and osteoarthritis who presented after sudden onset of severe headache, dizziness and vomiting after mowing the lawn at 1pm.  During transport, he had sudden decline in mental status, became unresponsive, CPR started and intubated. Emergent head CT revealed 4 x 6 cm acute hematoma left cerebellum with extension into the ventricles with mass-effect on the brainstem and obstructive mild hydrocephalus.  Neurosurgery found patient with minimal brainstem function, and their review of CT demonstrated very large cerebellar hematoma with compression of the pons, medulla, and midbrain and upward herniation through the incisura as well as tonsillar herniation.  He received Mannitol.  Per neurosurgery, with the patient's exam and CT findings they believe this to be a fatal hemorrhage and do not see a role for surgical evacuation as this would not improve chance of meaningful recovery. They recommended discussion with family regarding code status and change goals of care.  The patient is now DNR and there is ongoing goals of care discussion with the family by the primary  team.  Critical care consult was obtained for vent management. The patient was seen bedside.  He is comatose and unable to give history.  He is on mechanical ventilator, and appears comfortable.  He is on Clevidipine 62mcg/hour.  Current vent setting: PRVC Vt 620 RR 18 Peep 5 FiO2 50%   Past Medical History  1. Poorly controlled hypertension due to noncompliance 2. Dyslipidemia 3. Obesity 4. OSA  5. Osteoarthritis    Significant Hospital Events     Consults:  Neurosurgery Critical care  Procedures:    Significant Diagnostic Tests:  CT head shows catastrophic cerebellar bleed IMPRESSION: 1. 4 x 6 cm acute hematoma left cerebellum with extension into the ventricles. There is mass-effect on the brainstem and obstructive mild hydrocephalus.  CXR: 03/05/2020 IMPRESSION: 1. Endotracheal tube tip just above the carina. 2. Hazy left parahilar opacities, possibly pneumonia.  AXR 03/18/2020 IMPRESSION: Mild distension of the stomach with air. The OG tube terminates within the gastric body.  Micro Data:  None  Antimicrobials:  none   Interim history/subjective:  Comatose, absent brainstem reflexes this morning  Objective   Blood pressure 99/71, pulse 72, temperature (!) 96.6 F (35.9 C), temperature source Axillary, resp. rate 18, height 6\' 1"  (1.854 m), weight 120.7 kg, SpO2 95 %.    Vent Mode: PRVC FiO2 (%):  [40 %-100 %] 40 % Set Rate:  [12 bmp-18 bmp] 12 bmp Vt Set:  [620 mL-630 mL] 620 mL PEEP:  [5 cmH20] 5 cmH20 Plateau Pressure:  [16 cmH20-20 cmH20] 18 cmH20   Intake/Output Summary (Last 24 hours) at 03/31/2020 1424 Last data filed at 03/31/2020 0800 Gross per 24 hour  Intake 124.27 ml  Output 800 ml  Net -675.73 ml  Filed Weights   03/28/2020 1800  Weight: 120.7 kg    Examination: General: Acutely ill-appearing HENT: No significant asymmetry Lungs: Clear breath sounds Cardiovascular: S1-S2 appreciated Abdomen: Bowel sounds  appreciated Extremities: No clubbing, no edema Neuro: Does not withdraw to painful stimuli, no corneals, pupils constricted without response, no gag, no deep cough reflex with deep suctioning GU:   Resolved Hospital Problem list     Assessment & Plan:  Acute cerebellar hemorrhage with intraventricular extension A large cerebral hematoma with compression of the pons medulla and midbrain, upward herniation of the brain -Was without brainstem reflexes -According to neurosurgery-fatal hemorrhage with no surgical role -Following goals of care discussion with the family -Contacted organ donor services  Respiratory failure -Absence of brainstem reflexes -Continue ventilator support  Patient was examined with Dr. Vivi Martens this morning at 1005-no corneals, pupils fixed and nonreactive, no gag, no cough, no motor response no motor response  Continue ongoing goals of care discussion with family  Best practice:  Diet: N.p.o. Pain/Anxiety/Delirium protocol (if indicated): Not on any sedation VAP protocol (if indicated):  DVT prophylaxis:  GI prophylaxis: Protonix Glucose control:  Mobility: Bedrest Code Status: DNR Family Communication: Discussed with spouse at bedside Disposition: Brain-dead  Labs   CBC: Recent Labs  Lab 03/09/2020 1737 03/25/2020 1740 03/31/20 0244 03/31/20 1344  WBC 8.4  --   --   --   NEUTROABS 6.5  --   --   --   HGB 15.9 17.0 15.3 15.3  HCT 49.8 50.0 45.0 45.0  MCV 95.6  --   --   --   PLT 220  --   --   --     Basic Metabolic Panel: Recent Labs  Lab 03/04/2020 1737 03/16/2020 1740 03/31/20 0244 03/31/20 1344  NA 141 141 139 142  K 3.3* 3.2* 2.9* 3.4*  CL 103 102  --   --   CO2 24  --   --   --   GLUCOSE 197* 196*  --   --   BUN 26* 32*  --   --   CREATININE 1.38* 1.40*  --   --   CALCIUM 9.5  --   --   --    GFR: Estimated Creatinine Clearance: 77.3 mL/min (A) (by C-G formula based on SCr of 1.4 mg/dL (H)). Recent Labs  Lab 03/21/2020 1737  WBC  8.4    Liver Function Tests: Recent Labs  Lab 03/19/2020 1737  AST 42*  ALT 25  ALKPHOS 56  BILITOT 0.7  PROT 7.7  ALBUMIN 4.5   No results for input(s): LIPASE, AMYLASE in the last 168 hours. No results for input(s): AMMONIA in the last 168 hours.  ABG    Component Value Date/Time   PHART 7.613 (HH) Garcia 1344   PCO2ART 27.9 (L) Garcia 1344   PO2ART 54 (L) Garcia 1344   HCO3 28.5 (H) Garcia 1344   TCO2 29 Garcia 1344   O2SAT 94.0 Garcia 1344     Coagulation Profile: Recent Labs  Lab 03/18/2020 1737  INR 0.9    Cardiac Enzymes: No results for input(s): CKTOTAL, CKMB, CKMBINDEX, TROPONINI in the last 168 hours.  HbA1C: Hemoglobin A1C  Date/Time Value Ref Range Status  05/04/2014 09:01 AM 5.3  Final   Hgb A1c MFr Bld  Date/Time Value Ref Range Status  10/08/2017 11:53 AM 5.7 (H) <5.7 % of total Hgb Final    Comment:    For someone without known diabetes, a hemoglobin  A1c  value between 5.7% and 6.4% is consistent with prediabetes and should be confirmed with a  follow-up test. . For someone with known diabetes, a value <7% indicates that their diabetes is well controlled. A1c targets should be individualized based on duration of diabetes, age, comorbid conditions, and other considerations. . This assay result is consistent with an increased risk of diabetes. . Currently, no consensus exists regarding use of hemoglobin A1c for diagnosis of diabetes for children. .     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   Surgical History    Past Surgical History:  Procedure Laterality Date  . CARDIAC CATHETERIZATION  06/21/12  . LEFT HEART CATHETERIZATION WITH CORONARY ANGIOGRAM  N/A 06/21/2012   Procedure: LEFT HEART CATHETERIZATION WITH CORONARY ANGIOGRAM;  Surgeon: Donato Schultz, MD;  Location: [facility] C[facility] LAB;  Service: Cardiovascular;  Laterality: N/A;  . NECK SURGERY    . ROTATOR CUFF REPAIR Right   . SPINE SURGERY       Social History   reports that he has never smoked. He has never used smokeless tobacco. He reports that he does not drink alcohol or use drugs.   Family History   His family history includes Cancer (age of onset: 32) in his mother; Diabetes in his mother; Heart disease in his father; Mental illness in his brother; Stroke in his mother.   Allergies Allergies  Allergen Reactions  . Bee Venom Swelling    The patient is critically ill with multiple organ systems failure and requires high complexity decision making for assessment and support, frequent evaluation and titration of therapies, application of advanced monitoring technologies and extensive interpretation of multiple databases. Critical Care Time devoted to patient care services described in this note independent of APP/resident time (if applicable)  is 30 minutes.   Virl Diamond MD [facility] Personal pager: [phone] If unanswered, please page CCM On-call: [phone]

## 2020-04-01 ENCOUNTER — Encounter (HOSPITAL_COMMUNITY): Payer: BC Managed Care – PPO | Admitting: Certified Registered"

## 2020-04-01 ENCOUNTER — Encounter (HOSPITAL_COMMUNITY): Admission: EM | Disposition: E | Payer: Self-pay | Source: Home / Self Care | Attending: Neurology

## 2020-04-01 DIAGNOSIS — J9601 Acute respiratory failure with hypoxia: Secondary | ICD-10-CM

## 2020-04-01 HISTORY — PX: ORGAN PROCUREMENT: SHX5270

## 2020-04-01 LAB — URINALYSIS, ROUTINE W REFLEX MICROSCOPIC
Bilirubin Urine: NEGATIVE
Glucose, UA: 50 mg/dL — AB
Ketones, ur: NEGATIVE mg/dL
Leukocytes,Ua: NEGATIVE
Nitrite: NEGATIVE
Protein, ur: 100 mg/dL — AB
RBC / HPF: >50 RBC/hpf — ABNORMAL HIGH (ref 0–5)
Specific Gravity, Urine: 1.02 (ref 1.005–1.030)
pH: 6 (ref 5.0–8.0)

## 2020-04-01 LAB — COMPREHENSIVE METABOLIC PANEL
ALT: 77 U/L — ABNORMAL HIGH (ref 0–44)
ALT: 72 U/L — ABNORMAL HIGH (ref 0–44)
ALT: 66 U/L — ABNORMAL HIGH (ref 0–44)
AST: 52 U/L — ABNORMAL HIGH (ref 15–41)
AST: 46 U/L — ABNORMAL HIGH (ref 15–41)
AST: 37 U/L (ref 15–41)
Albumin: 3.1 g/dL — ABNORMAL LOW (ref 3.5–5.0)
Albumin: 3.1 g/dL — ABNORMAL LOW (ref 3.5–5.0)
Albumin: 3 g/dL — ABNORMAL LOW (ref 3.5–5.0)
Alkaline Phosphatase: 43 U/L (ref 38–126)
Alkaline Phosphatase: 42 U/L (ref 38–126)
Alkaline Phosphatase: 42 U/L (ref 38–126)
Anion gap: 10 (ref 5–15)
Anion gap: 12 (ref 5–15)
Anion gap: 14 (ref 5–15)
BUN: 36 mg/dL — ABNORMAL HIGH (ref 6–20)
BUN: 48 mg/dL — ABNORMAL HIGH (ref 6–20)
BUN: 54 mg/dL — ABNORMAL HIGH (ref 6–20)
CO2: 28 mmol/L (ref 22–32)
CO2: 26 mmol/L (ref 22–32)
CO2: 27 mmol/L (ref 22–32)
Calcium: 8.6 mg/dL — ABNORMAL LOW (ref 8.9–10.3)
Calcium: 8.8 mg/dL — ABNORMAL LOW (ref 8.9–10.3)
Calcium: 8.6 mg/dL — ABNORMAL LOW (ref 8.9–10.3)
Chloride: 105 mmol/L (ref 98–111)
Chloride: 104 mmol/L (ref 98–111)
Chloride: 103 mmol/L (ref 98–111)
Creatinine, Ser: 3.3 mg/dL — ABNORMAL HIGH (ref 0.61–1.24)
Creatinine, Ser: 4.37 mg/dL — ABNORMAL HIGH (ref 0.61–1.24)
Creatinine, Ser: 4.3 mg/dL — ABNORMAL HIGH (ref 0.61–1.24)
GFR calc Af Amer: 22 mL/min — ABNORMAL LOW (ref >60)
GFR calc Af Amer: 16 mL/min — ABNORMAL LOW (ref >60)
GFR calc Af Amer: 16 mL/min — ABNORMAL LOW (ref >60)
GFR calc non Af Amer: 19 mL/min — ABNORMAL LOW (ref >60)
GFR calc non Af Amer: 14 mL/min — ABNORMAL LOW (ref >60)
GFR calc non Af Amer: 14 mL/min — ABNORMAL LOW (ref >60)
Glucose, Bld: 143 mg/dL — ABNORMAL HIGH (ref 70–99)
Glucose, Bld: 167 mg/dL — ABNORMAL HIGH (ref 70–99)
Glucose, Bld: 167 mg/dL — ABNORMAL HIGH (ref 70–99)
Potassium: 4.6 mmol/L (ref 3.5–5.1)
Potassium: 4 mmol/L (ref 3.5–5.1)
Potassium: 4 mmol/L (ref 3.5–5.1)
Sodium: 143 mmol/L (ref 135–145)
Sodium: 142 mmol/L (ref 135–145)
Sodium: 144 mmol/L (ref 135–145)
Total Bilirubin: 0.8 mg/dL (ref 0.3–1.2)
Total Bilirubin: 0.4 mg/dL (ref 0.3–1.2)
Total Bilirubin: 0.8 mg/dL (ref 0.3–1.2)
Total Protein: 5.7 g/dL — ABNORMAL LOW (ref 6.5–8.1)
Total Protein: 6.2 g/dL — ABNORMAL LOW (ref 6.5–8.1)
Total Protein: 6 g/dL — ABNORMAL LOW (ref 6.5–8.1)

## 2020-04-01 LAB — URINE CULTURE

## 2020-04-01 LAB — CBC
HCT: 44.4 % (ref 39.0–52.0)
Hemoglobin: 14.3 g/dL (ref 13.0–17.0)
MCH: 30.8 pg (ref 26.0–34.0)
MCHC: 32.2 g/dL (ref 30.0–36.0)
MCV: 95.5 fL (ref 80.0–100.0)
Platelets: 210 10*3/uL (ref 150–400)
RBC: 4.65 MIL/uL (ref 4.22–5.81)
RDW: 15.2 % (ref 11.5–15.5)
WBC: 9.3 10*3/uL (ref 4.0–10.5)
nRBC: 0 % (ref 0.0–0.2)

## 2020-04-01 LAB — POCT I-STAT 7, (LYTES, BLD GAS, ICA,H+H)
Acid-Base Excess: 7 mmol/L — ABNORMAL HIGH (ref 0.0–2.0)
Bicarbonate: 33.9 mmol/L — ABNORMAL HIGH (ref 20.0–28.0)
Calcium, Ion: 1.21 mmol/L (ref 1.15–1.40)
HCT: 43 % (ref 39.0–52.0)
Hemoglobin: 14.6 g/dL (ref 13.0–17.0)
O2 Saturation: 100 %
Patient temperature: 99.8
Potassium: 4.5 mmol/L (ref 3.5–5.1)
Sodium: 141 mmol/L (ref 135–145)
TCO2: 35 mmol/L — ABNORMAL HIGH (ref 22–32)
pCO2 arterial: 55.2 mmHg — ABNORMAL HIGH (ref 32.0–48.0)
pH, Arterial: 7.399 (ref 7.350–7.450)
pO2, Arterial: 178 mmHg — ABNORMAL HIGH (ref 83.0–108.0)

## 2020-04-01 SURGERY — SURGICAL PROCUREMENT, ORGAN
Anesthesia: General

## 2020-04-01 MED ORDER — GLYCOPYRROLATE 0.2 MG/ML IJ SOLN: 0.2000 mg | INTRAMUSCULAR | Status: DC | PRN

## 2020-04-01 MED ORDER — DIPHENHYDRAMINE HCL 50 MG/ML IJ SOLN: 25.0000 mg | INTRAMUSCULAR | Status: DC | PRN

## 2020-04-01 MED ORDER — POLYVINYL ALCOHOL 1.4 % OP SOLN
1.0000 [drp] | Freq: Four times a day (QID) | OPHTHALMIC | Status: DC | PRN
  Filled 2020-04-01: qty 15

## 2020-04-01 MED ORDER — LORAZEPAM 2 MG/ML IJ SOLN: 2.0000 mg | INTRAMUSCULAR | Status: DC | PRN

## 2020-04-01 MED ORDER — LORAZEPAM 2 MG/ML IJ SOLN
2.0000 mg | INTRAMUSCULAR | Status: DC | PRN
  Administered 2020-04-01 (×2): 4 mg via INTRAVENOUS
  Filled 2020-04-01 (×2): qty 2

## 2020-04-01 MED ORDER — MORPHINE SULFATE (PF) 2 MG/ML IV SOLN
2.0000 mg | INTRAVENOUS | Status: DC | PRN
Start: 2020-04-01 — End: 2020-04-01

## 2020-04-01 MED ORDER — GLYCOPYRROLATE 1 MG PO TABS
1.0000 mg | ORAL_TABLET | ORAL | Status: DC | PRN
  Filled 2020-04-01: qty 1

## 2020-04-01 MED ORDER — DEXTROSE 5 % IV SOLN: INTRAVENOUS | Status: DC

## 2020-04-01 MED ORDER — GLYCOPYRROLATE 0.2 MG/ML IJ SOLN: 0.1000 mg | INTRAMUSCULAR | Status: DC | PRN

## 2020-04-01 MED ORDER — MORPHINE SULFATE (PF) 2 MG/ML IV SOLN
2.0000 mg | INTRAVENOUS | Status: DC | PRN
  Administered 2020-04-01 (×2): 4 mg via INTRAVENOUS
  Filled 2020-04-01 (×4): qty 2

## 2020-04-01 MED ORDER — GLYCOPYRROLATE 1 MG PO TABS: 1.0000 mg | ORAL_TABLET | ORAL | Status: DC | PRN

## 2020-04-01 MED ORDER — 0.9 % SODIUM CHLORIDE (POUR BTL) OPTIME
TOPICAL | Status: DC | PRN
  Administered 2020-04-01 (×7): 1000 mL

## 2020-04-01 MED ORDER — ALTEPLASE (PULMONARY EMBOLISM) INFUSION
100.0000 mg | Freq: Once | INTRAVENOUS | Status: DC
  Filled 2020-04-01: qty 100

## 2020-04-01 MED ORDER — HEPARIN SODIUM (PORCINE) 1000 UNIT/ML IJ SOLN
12000.0000 [IU] | Freq: Once | INTRAMUSCULAR | Status: AC
  Administered 2020-04-01: 20000 [IU] via INTRAVENOUS
  Filled 2020-04-01: qty 12

## 2020-04-01 MED ORDER — CLEVIDIPINE BUTYRATE 0.5 MG/ML IV EMUL
0.0000 mg/h | INTRAVENOUS | Status: DC
  Administered 2020-04-01: 6 mg/h via INTRAVENOUS
  Administered 2020-04-01: 4 mg/h via INTRAVENOUS
  Filled 2020-04-01: qty 50

## 2020-04-01 SURGICAL SUPPLY — 90 items
BLADE CLIPPER SURG (BLADE) IMPLANT
BLADE SAW STERNAL (BLADE) ×3 IMPLANT
BLADE STERNUM SYSTEM 6 (BLADE) ×3 IMPLANT
BLADE SURG 10 STRL SS (BLADE) IMPLANT
CLIP VESOCCLUDE MED 24/CT (CLIP) IMPLANT
CLIP VESOCCLUDE SM WIDE 24/CT (CLIP) IMPLANT
CNTNR URN SCR LID CUP LEK RST (MISCELLANEOUS) ×1 IMPLANT
CONT SPEC 4OZ STRL OR WHT (MISCELLANEOUS) ×3
COVER BACK TABLE 60X90IN (DRAPES) IMPLANT
COVER MAYO STAND STRL (DRAPES) IMPLANT
COVER SURGICAL LIGHT HANDLE (MISCELLANEOUS) ×3 IMPLANT
COVER WAND RF STERILE (DRAPES) ×3 IMPLANT
DRAPE HALF SHEET 40X57 (DRAPES) IMPLANT
DRAPE SLUSH MACHINE 52X66 (DRAPES) ×3 IMPLANT
DRSG COVADERM 4X10 (GAUZE/BANDAGES/DRESSINGS) ×12 IMPLANT
DRSG COVADERM 4X8 (GAUZE/BANDAGES/DRESSINGS) ×3 IMPLANT
DRSG MEPILEX BORDER 4X12 (GAUZE/BANDAGES/DRESSINGS) ×3 IMPLANT
DRSG TELFA 3X8 NADH (GAUZE/BANDAGES/DRESSINGS) ×3 IMPLANT
DURAPREP 26ML APPLICATOR (WOUND CARE) IMPLANT
ELECT BLADE 6.5 EXT (BLADE) IMPLANT
ELECT REM PT RETURN 9FT ADLT (ELECTROSURGICAL) ×6
ELECTRODE REM PT RTRN 9FT ADLT (ELECTROSURGICAL) ×2 IMPLANT
GAUZE 4X4 16PLY RFD (DISPOSABLE) IMPLANT
GLOVE BIO SURGEON STRL SZ7 (GLOVE) IMPLANT
GLOVE BIO SURGEON STRL SZ7.5 (GLOVE) IMPLANT
GLOVE BIO SURGEON STRL SZ8 (GLOVE) IMPLANT
GLOVE BIO SURGEON STRL SZ8.5 (GLOVE) IMPLANT
GLOVE BIOGEL PI IND STRL 7.0 (GLOVE) IMPLANT
GLOVE BIOGEL PI IND STRL 7.5 (GLOVE) IMPLANT
GLOVE BIOGEL PI IND STRL 8 (GLOVE) IMPLANT
GLOVE BIOGEL PI IND STRL 8.5 (GLOVE) IMPLANT
GLOVE BIOGEL PI INDICATOR 7.0 (GLOVE)
GLOVE BIOGEL PI INDICATOR 7.5 (GLOVE)
GLOVE BIOGEL PI INDICATOR 8 (GLOVE)
GLOVE BIOGEL PI INDICATOR 8.5 (GLOVE)
GLOVE SURG SS PI 7.0 STRL IVOR (GLOVE) IMPLANT
GLOVE SURG SS PI 7.5 STRL IVOR (GLOVE) IMPLANT
GLOVE SURG SS PI 8.0 STRL IVOR (GLOVE) IMPLANT
GOWN STRL REUS W/ TWL LRG LVL3 (GOWN DISPOSABLE) ×4 IMPLANT
GOWN STRL REUS W/ TWL XL LVL3 (GOWN DISPOSABLE) ×2 IMPLANT
GOWN STRL REUS W/TWL LRG LVL3 (GOWN DISPOSABLE) ×12
GOWN STRL REUS W/TWL XL LVL3 (GOWN DISPOSABLE) ×6
HANDLE SUCTION POOLE (INSTRUMENTS) IMPLANT
KIT POST MORTEM ADULT 36X90 (BAG) ×3 IMPLANT
KIT TURNOVER KIT B (KITS) ×3 IMPLANT
LOOP VESSEL MAXI BLUE (MISCELLANEOUS) IMPLANT
LOOP VESSEL MINI RED (MISCELLANEOUS) IMPLANT
MANIFOLD NEPTUNE II (INSTRUMENTS) ×3 IMPLANT
NEEDLE BIOPSY 14X6 SOFT TISS (NEEDLE) IMPLANT
NS IRRIG 1000ML POUR BTL (IV SOLUTION) IMPLANT
PACK AORTA (CUSTOM PROCEDURE TRAY) ×3 IMPLANT
PAD ARMBOARD 7.5X6 YLW CONV (MISCELLANEOUS) ×6 IMPLANT
PENCIL BUTTON HOLSTER BLD 10FT (ELECTRODE) ×3 IMPLANT
SOL PREP POV-IOD 4OZ 10% (MISCELLANEOUS) ×6 IMPLANT
SPONGE INTESTINAL PEANUT (DISPOSABLE) IMPLANT
SPONGE LAP 18X18 RF (DISPOSABLE) IMPLANT
STAPLER VISISTAT 35W (STAPLE) ×3 IMPLANT
SUCTION POOLE HANDLE (INSTRUMENTS)
SUT BONE WAX W31G (SUTURE) IMPLANT
SUT ETHIBOND 5 LR DA (SUTURE) IMPLANT
SUT ETHILON 1 LR 30 (SUTURE) ×9 IMPLANT
SUT ETHILON 2 LR (SUTURE) IMPLANT
SUT PDS AB 4-0 RB1 27 (SUTURE) ×3 IMPLANT
SUT PROLENE 3 0 RB 1 (SUTURE) IMPLANT
SUT PROLENE 3 0 SH 1 (SUTURE) IMPLANT
SUT PROLENE 4 0 RB 1 (SUTURE) ×3
SUT PROLENE 4-0 RB1 .5 CRCL 36 (SUTURE) ×1 IMPLANT
SUT PROLENE 5 0 C 1 24 (SUTURE) IMPLANT
SUT PROLENE 6 0 BV (SUTURE) IMPLANT
SUT SILK 0 TIES 10X30 (SUTURE) ×3 IMPLANT
SUT SILK 1 SH (SUTURE) IMPLANT
SUT SILK 1 TIES 10X30 (SUTURE) IMPLANT
SUT SILK 2 0 (SUTURE)
SUT SILK 2 0 SH (SUTURE) IMPLANT
SUT SILK 2 0 SH CR/8 (SUTURE) ×3 IMPLANT
SUT SILK 2 0 TIES 10X30 (SUTURE) ×3 IMPLANT
SUT SILK 2-0 18XBRD TIE 12 (SUTURE) IMPLANT
SUT SILK 3 0 (SUTURE) ×3
SUT SILK 3 0 SH CR/8 (SUTURE) IMPLANT
SUT SILK 3 0 TIES 10X30 (SUTURE) IMPLANT
SUT SILK 3-0 18XBRD TIE 12 (SUTURE) ×1 IMPLANT
SWAB COLLECTION DEVICE MRSA (MISCELLANEOUS) IMPLANT
SWAB CULTURE ESWAB REG 1ML (MISCELLANEOUS) IMPLANT
SYR 50ML LL SCALE MARK (SYRINGE) IMPLANT
SYRINGE TOOMEY DISP (SYRINGE) IMPLANT
TAPE UMBILICAL 1/8 X36 TWILL (MISCELLANEOUS) IMPLANT
TUBE CONNECTING 12'X1/4 (SUCTIONS) ×1
TUBE CONNECTING 12X1/4 (SUCTIONS) ×2 IMPLANT
WATER STERILE IRR 1000ML POUR (IV SOLUTION) IMPLANT
YANKAUER SUCT BULB TIP NO VENT (SUCTIONS) ×3 IMPLANT

## 2020-04-02 LAB — TYPE AND SCREEN
ABO/RH(D): O POS
Antibody Screen: NEGATIVE
Unit division: 0
Unit division: 0
Unit division: 0
Unit division: 0

## 2020-04-02 LAB — BPAM RBC
Blood Product Expiration Date: 202107022359
Blood Product Expiration Date: 202107052359
Blood Product Expiration Date: 202107052359
Blood Product Expiration Date: 202107052359
Unit Type and Rh: 5100
Unit Type and Rh: 5100
Unit Type and Rh: 5100
Unit Type and Rh: 5100

## 2020-04-02 NOTE — Progress Notes (Signed)
On-call note  Called by patient RN and informed that the patient passed away-time of death Mar 10, 2005 hrs. on 04-28-2020. I went to the unit and sign the death certificate.  Patient was already in the OR for organ donation  Death summary note will be prepared by the primary team (stroke neurology) in the a.m.  I will inform the stroke team.  -- Milon Dikes, MD [facility] Pager: [phone] If 7pm to 7am, please call on call as listed on AMION.

## 2020-04-02 NOTE — Progress Notes (Signed)
NAME:  Kristopher Garcia, MRN:  [id number], DOB:  04-Sep-1961, LOS: 2 ADMISSION DATE:  22-Apr-2020, CONSULTATION DATE:  5/29 REFERRING MD:  Aroor, CHIEF COMPLAINT:  comatose   Brief History   59 y/o male admitted with large incranial hematoma> had cardiac arrest.  Patient presented with sudden onset severe headache, dizziness and vomiting, then suddenly became unresponsive requiring CPR and intubation. CT head demonstratedvery large cerebellar hematoma with compression of the pons, medulla, and midbrain and upward herniation through the incisura as well as tonsillar herniation. Per neurosurgery, patient is without any significant neurological response and not surgical candidate, therefore recommended goals of care discussion with the family.   Past Medical History  1. Poorly controlled hypertension due to noncompliance 2. Dyslipidemia 3. Obesity 4. OSA  5. Osteoarthritis  Significant Hospital Events     Consults:  NSGY PCCM  Procedures:    Significant Diagnostic Tests:  CT head shows catastrophic cerebellar bleed IMPRESSION: 1. 4 x 6 cm acute hematoma left cerebellum with extension into the ventricles. There is mass-effect on the brainstem and obstructive mild hydrocephalus.  CXR: 22-Apr-2020 IMPRESSION: 1. Endotracheal tube tip just above the carina. 2. Hazy left parahilar opacities, possibly pneumonia.  AXR 2020/04/22 IMPRESSION: Mild distension of the stomach with air. The OG tube terminates within the gastric body.  Micro Data:    Antimicrobials:     Interim history/subjective:  Slight resp effort, otherwise unresponsive  Objective   Blood pressure 125/70, pulse 70, temperature 99.6 F (37.6 C), temperature source Axillary, resp. rate 12, height 6\' 1"  (1.854 m), weight 120.7 kg, SpO2 99 %.    Vent Mode: PRVC FiO2 (%):  [30 %-50 %] 50 % Set Rate:  [12 bmp] 12 bmp Vt Set:  [500 mL-640 mL] 500 mL PEEP:  [5 cmH20-8 cmH20] 8 cmH20 Plateau Pressure:  [14  cmH20-20 cmH20] 15 cmH20   Intake/Output Summary (Last 24 hours) at 03/07/2020 1229 Last data filed at 03/18/2020 0700 Gross per 24 hour  Intake 1795.82 ml  Output 1630 ml  Net 165.82 ml   Filed Weights   04/22/20 1800 04/22/2020 2300  Weight: 120.7 kg 120.7 kg    Examination: General:  In bed on vent HENT: NCAT ETT in place PULM: CTA B, vent supported breathing CV: RRR, no mgr GI: BS+, soft, nontender MSK: normal bulk and tone Neuro: GCS 3, will initiate breaths spontaneously   Resolved Hospital Problem list      Assessment & Plan:  Acute respiratory failure with hypoxemia due to inability to protect airway Full mechanical vent support VAP prevention Daily WUA/SBT  AKI: No escalation of care  Shock liver No escalation of care  Acute cerebellar hemorrhage with intraventricular extension Severe brain injury but not brain dead; overall prognosis dismal Failed apnea test 5/30 and 5/30 Family wishes to withdraw care later today after family visits  Best practice:  Diet: npo Pain/Anxiety/Delirium protocol (if indicated): no sedation VAP protocol (if indicated): yes DVT prophylaxis: SCD GI prophylaxis: protonix Glucose control: SSI Mobility: bed rest Code Status: DNR Family Communication: wife updated at length on 5/31, she wishes to withdraw care after family visits Disposition:   Labs   CBC: Recent Labs  Lab 04/22/2020 1737 2020/04/22 1740 03/31/20 1344 03/31/20 1508 03/31/20 1659 03/31/20 2326 03/21/2020 0320  WBC 8.4  --   --  10.1  --  9.3  --   NEUTROABS 6.5  --   --  8.7*  --   --   --   HGB  15.9   < > 15.3 12.5* 15.6 14.3 14.6  HCT 49.8   < > 45.0 37.9* 46.0 44.4 43.0  MCV 95.6  --   --  93.3  --  95.5  --   PLT 220  --   --  286  --  210  --    < > = values in this interval not displayed.    Basic Metabolic Panel: Recent Labs  Lab April 14, 2020 1737 Apr 14, 2020 1737 2020/04/14 1740 03/31/20 0244 03/31/20 1508 03/31/20 1659 03/31/20 2326  03/20/2020 0320 03/14/2020 1012  NA 141   < > 141   < > 144 142 143 141 142  K 3.3*   < > 3.2*   < > 3.3* 3.2* 4.6 4.5 4.0  CL 103  --  102  --  102  --  105  --  104  CO2 24  --   --   --  29  --  28  --  26  GLUCOSE 197*  --  196*  --  136*  --  143*  --  167*  BUN 26*  --  32*  --  30*  --  36*  --  48*  CREATININE 1.38*  --  1.40*  --  1.94*  --  3.30*  --  4.37*  CALCIUM 9.5  --   --   --  9.5  --  8.6*  --  8.8*  MG  --   --   --   --  2.4  --   --   --   --   PHOS  --   --   --   --  3.2  --   --   --   --    < > = values in this interval not displayed.   GFR: Estimated Creatinine Clearance: 24.8 mL/min (A) (by C-G formula based on SCr of 4.37 mg/dL (H)). Recent Labs  Lab Apr 14, 2020 1737 03/31/20 1508 03/31/20 2326  WBC 8.4 10.1 9.3    Liver Function Tests: Recent Labs  Lab 04-14-2020 1737 03/31/20 1508 03/31/20 2326 03/04/2020 1012  AST 42* 78* 52* 46*  ALT 25 96* 77* 72*  ALKPHOS 56 55 43 42  BILITOT 0.7 1.1 0.8 0.4  PROT 7.7 7.0 5.7* 6.2*  ALBUMIN 4.5 3.7 3.1* 3.1*   No results for input(s): LIPASE, AMYLASE in the last 168 hours. No results for input(s): AMMONIA in the last 168 hours.  ABG    Component Value Date/Time   PHART 7.399 03/26/2020 0320   PCO2ART 55.2 (H) 03/07/2020 0320   PO2ART 178 (H) 03/02/2020 0320   HCO3 33.9 (H) 03/18/2020 0320   TCO2 35 (H) 03/14/2020 0320   O2SAT 100.0 03/19/2020 0320     Coagulation Profile: Recent Labs  Lab 2020-04-14 1737  INR 0.9    Cardiac Enzymes: No results for input(s): CKTOTAL, CKMB, CKMBINDEX, TROPONINI in the last 168 hours.  HbA1C: Hemoglobin A1C  Date/Time Value Ref Range Status  05/04/2014 09:01 AM 5.3  Final   Hgb A1c MFr Bld  Date/Time Value Ref Range Status  10/08/2017 11:53 AM 5.7 (H) <5.7 % of total Hgb Final    Comment:    For someone without known diabetes, a hemoglobin  A1c value between 5.7% and 6.4% is consistent with prediabetes and should be confirmed with a  follow-up test. . For  someone with known diabetes, a value <7% indicates that their diabetes is well controlled. A1c targets should  be individualized based on duration of diabetes, age, comorbid conditions, and other considerations. . This assay result is consistent with an increased risk of diabetes. . Currently, no consensus exists regarding use of hemoglobin A1c for diagnosis of diabetes for children. .     CBG: Recent Labs  Lab 03/29/2020 1929  GLUCAP 211*     Critical care time: 31 minutes      Heber [place], MD [facility] Pager: [phone] Cell: [phone] If no response, call [phone]

## 2020-04-02 NOTE — Progress Notes (Signed)
eLink Physician-Brief Progress Note Patient Name: Kristopher Garcia DOB: 04/09/1961 MRN: [id number]   Date of Service  04/20/2020  HPI/Events of Note  Type 2 resp failure. pao2 159. On fio2 50%.   eICU Interventions  Comfort care/organ donation. Can go down on fio2 as needed.     Intervention Category Major Interventions: Acid-Base disturbance - evaluation and management  Ranee Gosselin 04-20-2020, 5:43 AM

## 2020-04-02 NOTE — Progress Notes (Signed)
[facility] PCCM  The patient's family has elected to pursue organ donation.  Plan for withdrawal of care and organ donation after cardiac death this evening.  Comfort care orders written: morphine, ativan, robinol TPA orders written per CDS request (on call for OR).  Heber [place], MD [facility] Pager: [phone] Cell: [phone] If no response, call [phone]

## 2020-04-02 NOTE — Anesthesia Preprocedure Evaluation (Deleted)
Anesthesia Evaluation   Patient unresponsive    Reviewed: Allergy & Precautions, Patient's Chart, lab work & pertinent test results, Unable to perform ROS - Chart review only  History of Anesthesia Complications Negative for: history of anesthetic complications  Airway        Dental   Pulmonary sleep apnea ,           Cardiovascular hypertension,      Neuro/Psych  ICH     GI/Hepatic hiatal hernia,  Elevated LFTs    Endo/Other   Obesity   Renal/GU ARFRenal disease     Musculoskeletal  (+) Arthritis ,   Abdominal   Peds  Hematology   Anesthesia Other Findings Covid neg 5/30  Per CCM - "Acute respiratory failure with hypoxemia due to inability to protect airway  AKI:No escalation of care Shock liver: No escalation of care Acute cerebellar hemorrhage with intraventricular extension. Severe brain injury but not brain dead; overall prognosis dismal. Failed apnea test 5/30 and 5/30. Family wishes to withdraw care later today after family visits"  Reproductive/Obstetrics                            Anesthesia Physical Anesthesia Plan  ASA: VI  Anesthesia Plan: General   Post-op Pain Management:    Induction: Inhalational  PONV Risk Score and Plan: 0 and Treatment may vary due to age or medical condition  Airway Management Planned: Oral ETT  Additional Equipment:   Intra-op Plan:   Post-operative Plan:   Informed Consent:   Patient has DNR.  Continue DNR.   History available from chart only  Plan Discussed with: CRNA and Anesthesiologist  Anesthesia Plan Comments:         Anesthesia Quick Evaluation

## 2020-04-02 NOTE — Progress Notes (Signed)
STROKE TEAM PROGRESS NOTE   INTERVAL HISTORY His wife at the bedside this morning and pccm attending.  We had a long discussion about patient's current status, the severity of his stroke, poor prognosis.  I answered all questions.  Spent an extended amount of time at the bedside with family yesterdy.  We also discussed organ donation, patient has no cranial nerve reflexes but apnea testing showed shallow breathing discuss with PCCM last evening and Dr. Laurence Slate will retry apnea test possibly today. Can also consider CT perfusion or eeg. Wife has good understanding, pccm calling [place] [facility] again today for further direction.   OBJECTIVE Vitals:   2020/04/08 0700 04/08/2020 0800 April 08, 2020 0811 08-Apr-2020 0812  BP: 125/80  129/80   Pulse: 77  77   Resp: 13  12   Temp:  99.6 F (37.6 C)    TempSrc:  Axillary    SpO2: 99%  99% 99%  Weight:      Height:        CBC:  Recent Labs  Lab 03/18/2020 1737 03/24/2020 1740 03/31/20 1508 03/31/20 1659 03/31/20 2326 04/08/20 0320  WBC 8.4   < > 10.1  --  9.3  --   NEUTROABS 6.5  --  8.7*  --   --   --   HGB 15.9   < > 12.5*   < > 14.3 14.6  HCT 49.8   < > 37.9*   < > 44.4 43.0  MCV 95.6   < > 93.3  --  95.5  --   PLT 220   < > 286  --  210  --    < > = values in this interval not displayed.    IMAGING past 24h CT ABDOMEN PELVIS WO CONTRAST  Result Date: 03/31/2020 CLINICAL DATA:  Cardiac transplant donor assessment. EXAM: CT CHEST, ABDOMEN AND PELVIS WITHOUT CONTRAST TECHNIQUE: Multidetector CT imaging of the chest, abdomen and pelvis was performed following the standard protocol without IV contrast. COMPARISON:  Chest radiograph yesterday. FINDINGS: CT CHEST FINDINGS Cardiovascular: Upper normal heart size, cardiac chambers difficult to delineate on noncontrast exam. No pericardial effusion. No visualized coronary artery calcifications. Normal caliber pulmonary trunk with main pulmonary artery measuring 3.1 cm. The thoracic aorta is normal in  caliber. There is common origin of the brachiocephalic and left common carotid artery, variant arch anatomy. Aortic measurements as follows: Sinotubular junction 3 cm, mid ascending 3.2 cm, distal ascending 3.1 cm. Transverse aorta just distal to the left subclavian takeoff 3 cm. Proximal descending aorta 3.2 cm. Mid descending aorta 2.8 cm. Distal descending aorta at the diaphragmatic hiatus 2.7 cm. Trace aortic atherosclerosis. Mediastinum/Nodes: Limited assessment for adenopathy given lack contrast on breathing motion. No bulky mediastinal lymph nodes. Enteric tube tip below the clavicles. Enteric tube decompresses the esophagus, however there is intraluminal esophageal fluid. No wall thickening. Lungs/Pleura: Dependent consolidations within both lower lobes with air bronchograms. No pulmonary edema. No evidence of pulmonary mass. Possible small pleural effusions. No significant debris in the trachea or bronchial tree. Musculoskeletal: Surgical hardware in the cervical spine with corpectomy. Exaggerated thoracic kyphosis with multilevel degenerative change. Prominent degenerative change of the right shoulder. No acute osseous abnormalities are seen. CT ABDOMEN PELVIS FINDINGS Hepatobiliary: No focal hepatic lesion. Equivocal hepatic steatosis, density difficult to accurately measure due to streak artifact from arms down positioning. Gallbladder is unremarkable without calcified gallstone. Pancreas: Unremarkable noncontrast appearance. No ductal dilatation or inflammation. Spleen: Normal in size without focal abnormality. Adrenals/Urinary Tract: Normal  right adrenal gland. Mild left adrenal thickening without dominant nodule. There is mild bilateral hydroureteronephrosis. No renal or ureteral calculi. No obvious renal mass. Urinary bladder is distended which may be a cause of ureteral distension. No bladder wall thickening. Stomach/Bowel: Enteric tube tip in the stomach, stomach is prominently distended with  fluid. Dependent densities within the stomach may be swallowed foreign bodies such as teeth. No small bowel obstruction or abnormal distention. Scattered colonic diverticula without diverticulitis. Vascular/Lymphatic: The abdominal aorta is normal in caliber. Mild distal atherosclerosis. No periaortic stranding. No bulky abdominopelvic adenopathy. Reproductive: Prostate is unremarkable. Other: No ascites or free air. Musculoskeletal: Diffuse degenerative change throughout the spine. Degenerative change of both hips. No focal bone lesion. IMPRESSION: 1. Dependent consolidations within both lower lobes with air bronchograms, suspicious for pneumonia, including aspiration. Possible small pleural effusions. 2. Enteric tube in place with tip in the stomach, however there is fluid distending the stomach and esophagus. 3. Mild bilateral hydroureteronephrosis, likely related to distended urinary bladder. 4. Dependent densities within the stomach may be swallowed foreign bodies such as teeth or other ingested material. Colonic diverticulosis without diverticulitis. 5. Minor thoracic and distal abdominal aortic atherosclerosis without aneurysm. Aortic Atherosclerosis (ICD10-I70.0). Electronically Signed   By: Narda Rutherford M.D.   On: 03/31/2020 15:49   CT CHEST WO CONTRAST  Result Date: 03/31/2020 CLINICAL DATA:  Cardiac transplant donor assessment. EXAM: CT CHEST, ABDOMEN AND PELVIS WITHOUT CONTRAST TECHNIQUE: Multidetector CT imaging of the chest, abdomen and pelvis was performed following the standard protocol without IV contrast. COMPARISON:  Chest radiograph yesterday. FINDINGS: CT CHEST FINDINGS Cardiovascular: Upper normal heart size, cardiac chambers difficult to delineate on noncontrast exam. No pericardial effusion. No visualized coronary artery calcifications. Normal caliber pulmonary trunk with main pulmonary artery measuring 3.1 cm. The thoracic aorta is normal in caliber. There is common origin of the  brachiocephalic and left common carotid artery, variant arch anatomy. Aortic measurements as follows: Sinotubular junction 3 cm, mid ascending 3.2 cm, distal ascending 3.1 cm. Transverse aorta just distal to the left subclavian takeoff 3 cm. Proximal descending aorta 3.2 cm. Mid descending aorta 2.8 cm. Distal descending aorta at the diaphragmatic hiatus 2.7 cm. Trace aortic atherosclerosis. Mediastinum/Nodes: Limited assessment for adenopathy given lack contrast on breathing motion. No bulky mediastinal lymph nodes. Enteric tube tip below the clavicles. Enteric tube decompresses the esophagus, however there is intraluminal esophageal fluid. No wall thickening. Lungs/Pleura: Dependent consolidations within both lower lobes with air bronchograms. No pulmonary edema. No evidence of pulmonary mass. Possible small pleural effusions. No significant debris in the trachea or bronchial tree. Musculoskeletal: Surgical hardware in the cervical spine with corpectomy. Exaggerated thoracic kyphosis with multilevel degenerative change. Prominent degenerative change of the right shoulder. No acute osseous abnormalities are seen. CT ABDOMEN PELVIS FINDINGS Hepatobiliary: No focal hepatic lesion. Equivocal hepatic steatosis, density difficult to accurately measure due to streak artifact from arms down positioning. Gallbladder is unremarkable without calcified gallstone. Pancreas: Unremarkable noncontrast appearance. No ductal dilatation or inflammation. Spleen: Normal in size without focal abnormality. Adrenals/Urinary Tract: Normal right adrenal gland. Mild left adrenal thickening without dominant nodule. There is mild bilateral hydroureteronephrosis. No renal or ureteral calculi. No obvious renal mass. Urinary bladder is distended which may be a cause of ureteral distension. No bladder wall thickening. Stomach/Bowel: Enteric tube tip in the stomach, stomach is prominently distended with fluid. Dependent densities within the  stomach may be swallowed foreign bodies such as teeth. No small bowel obstruction or abnormal distention. Scattered colonic  diverticula without diverticulitis. Vascular/Lymphatic: The abdominal aorta is normal in caliber. Mild distal atherosclerosis. No periaortic stranding. No bulky abdominopelvic adenopathy. Reproductive: Prostate is unremarkable. Other: No ascites or free air. Musculoskeletal: Diffuse degenerative change throughout the spine. Degenerative change of both hips. No focal bone lesion. IMPRESSION: 1. Dependent consolidations within both lower lobes with air bronchograms, suspicious for pneumonia, including aspiration. Possible small pleural effusions. 2. Enteric tube in place with tip in the stomach, however there is fluid distending the stomach and esophagus. 3. Mild bilateral hydroureteronephrosis, likely related to distended urinary bladder. 4. Dependent densities within the stomach may be swallowed foreign bodies such as teeth or other ingested material. Colonic diverticulosis without diverticulitis. 5. Minor thoracic and distal abdominal aortic atherosclerosis without aneurysm. Aortic Atherosclerosis (ICD10-I70.0). Electronically Signed   By: Keith Rake M.D.   On: 03/31/2020 15:49     PHYSICAL EXAM  Middle-aged African-American man, no acute distress, intubated, not sedated, does not open eyes to voice or sternal rub, not following commands, pupils pinpoint and nonreactive, no oculocephalic reflex, dysconjugate gaze,  corneals absent, facial symmetry could not ascertain due to ET tube, not blinking to threat, not tracking examiner, flaccid x4, no response to withdrawal, no gag or cough.   ASSESSMENT/PLAN Mr. Kristopher Garcia is a 59 y.o. male with history of uncontrolled HTN, obesity, OSA presented with sudden onset vomiting and headache SBP >240 -> right side flaccid weakness -> patient comatose, pupils fixed and dilated -> asystole -> CPR -> intubated. He did not receive IV t-PA  due to [place]. NS consult - Dr Kathyrn Sheriff - "I believe this to be a fatal hemorrhage. I do not see a role for surgical evacuation as this would not improve chance of meaningful recovery. Would recommend discussion with family regarding code status and change goals of care."  ICH: L cerebellum ICH w/ IVH secondary to hypertensive emergency  Code Stroke CT Head - 4 x 6 cm acute hematoma left cerebellum with extension into the ventricles. There is mass-effect on the brainstem and obstructive mild hydrocephalus.     [employer] Virus 2 - negative  VTE prophylaxis - SCDs d/c'd  No antithrombotic prior to admission, now on No antithrombotic given ICH  Poor neuro prognosis. NS (Nundkumar) believes fatal hemorrhage. No role for surgery.   No brainstem reflexes per neuro exam  Comfort care  Organ donation following. Apnea test remains positive  PCCM to call [place] [facility] for further direction   Respiratory Failure  Secondary to stroke  Intubated   For terminal wean  CCM onboard, discussed with attending this morning and we both spoke to patient's wife and answered all questions   Hypertension  Home BP meds: Norvasc ; lisinopril ; HCTZ  Treated with Cleviprex / Labetalol prn, now off   Dysphagia . Secondary to stroke . NPO   Other Stroke Risk Factors  Obesity, Body mass index is 35.09 kg/m.  Family hx stroke (mother)  Obstructive sleep apnea  Other Active Problems  Code status - DNR  Hyperglycemia  Possible aspiration pneumonia by CXR   Hypokalemia   CKD - stage 2   Cytotoxic edema   Hospital day # 2   Personally examined patient and images, and have participated in and made any corrections needed to history, physical, neuro exam,assessment and plan as stated above.  I have personally obtained the history, evaluated lab date, reviewed imaging studies and agree with radiology interpretations.   This patient is critically ill and at significant risk  of  neurological worsening, death and care requires constant monitoring of vital signs, hemodynamics,respiratory and cardiac monitoring,review of multiple databases, neurological assessment, discussion with family, other specialists and medical decision making of high complexity.I  I spent 30 minutes of neurocritical care time in the care of this patient.  Naomie Dean, MD [facility]   To contact Stroke Continuity provider, please refer to [email or web address]. After hours, contact General Neurology

## 2020-04-02 NOTE — OR Nursing (Signed)
TOD: 2206 Cross Clamp: [id number]

## 2020-04-02 NOTE — Progress Notes (Signed)
Transported pt to OR for organ donation. At 2138, pt extubated. This RN administered morphine and ativan for pt comfort. At 2201, Asher Muir, RN and Jonee Lamore, RN auscultated and no heart sounds heard. Per CDS protocol, auscultated a second time at 2206. No heart sounds heard. TOD 2206. Family Notified.

## 2020-04-02 DEATH — deceased

## 2020-04-03 LAB — SURGICAL PATHOLOGY

## 2020-04-05 LAB — CULTURE, BLOOD (ROUTINE X 2)
Culture: NO GROWTH
Culture: NO GROWTH
Special Requests: ADEQUATE
Special Requests: ADEQUATE

## 2020-09-26 IMAGING — DX DG ABDOMEN 1V
1 series · 1 of 1 positions shown · non-contrast
Comparison: None.

CLINICAL DATA: Evaluate OG tube placement

EXAM:
ABDOMEN - 1 VIEW

[abdomen kub]
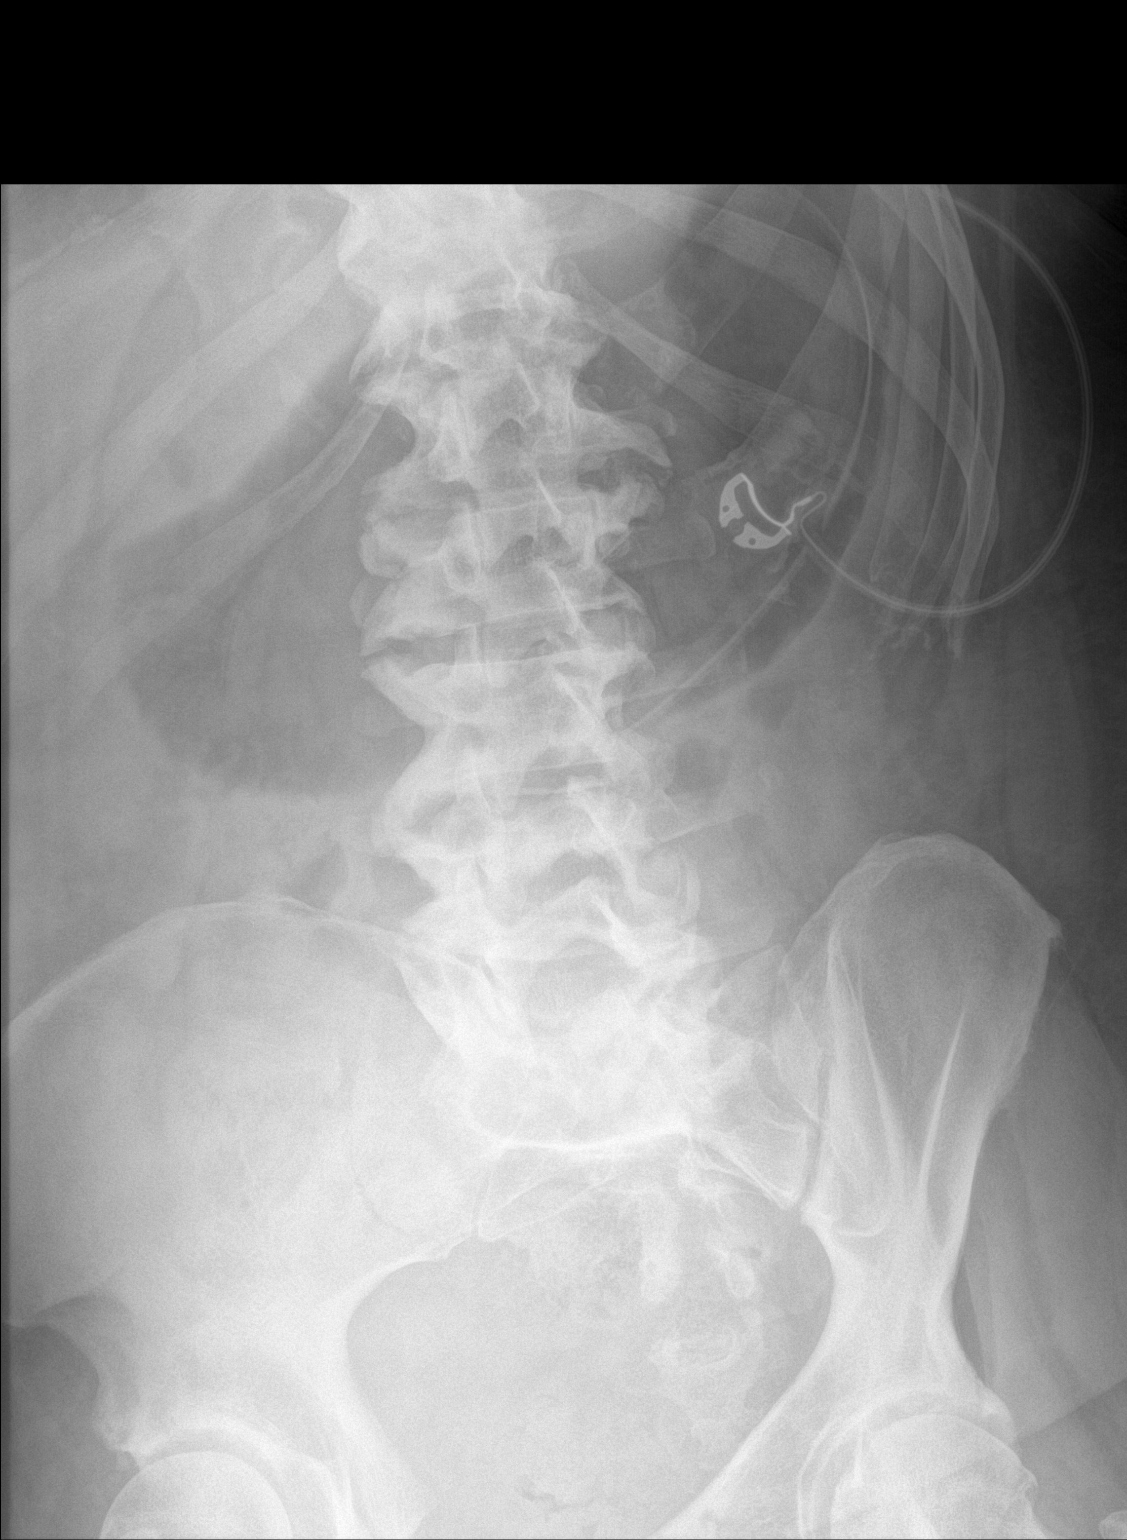

[1 of 1 positions shown; findings below may reference images not displayed]

FINDINGS: Stomach is somewhat distended with air. The OG tube terminates in
the region the gastric body.
IMPRESSION: Mild distension of the stomach with air. The OG tube terminates
within the gastric body.

## 2020-09-26 IMAGING — CT CT HEAD CODE STROKE
4 series · 16 of 47 positions shown, 18 images · non-contrast
Comparison: None.

CLINICAL DATA: Code stroke.  Acute neuro deficit.  Dizziness.

EXAM:
CT HEAD WITHOUT CONTRAST
TECHNIQUE: Contiguous axial images were obtained from the base of the skull
through the vertex without intravenous contrast.

[Series 3: head wo · axial · 0.44mm/px · z∈[-126,+4]mm · 7 of 36 slices shown, 9 images]
[im 5/36  brain]
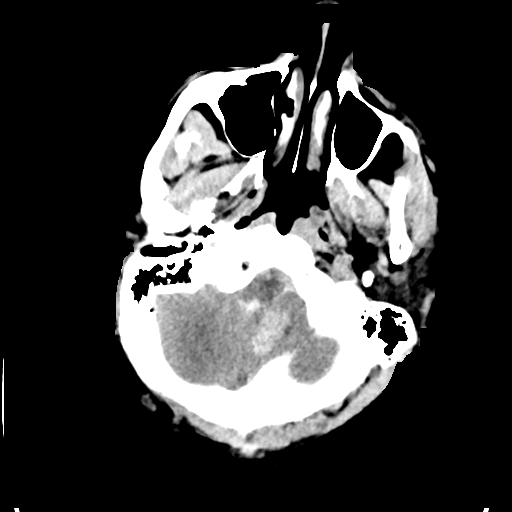
[im 5/36  bone]
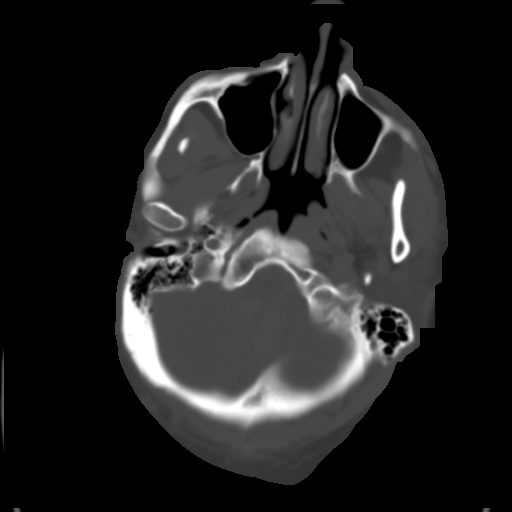
[im 9/36  brain]
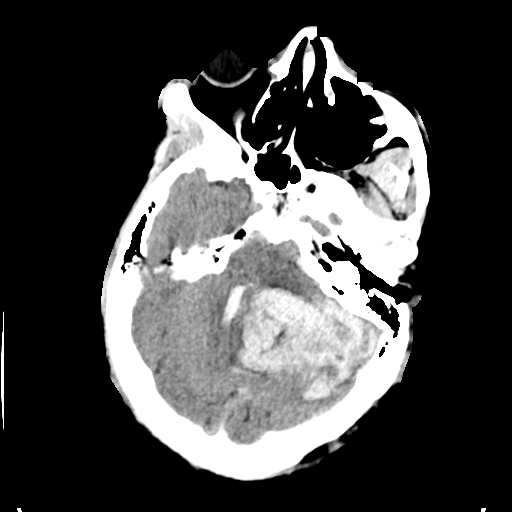
[im 14/36  brain]
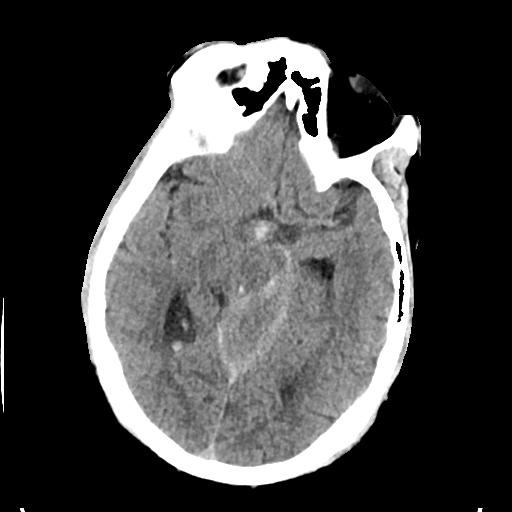
[im 18/36  brain]
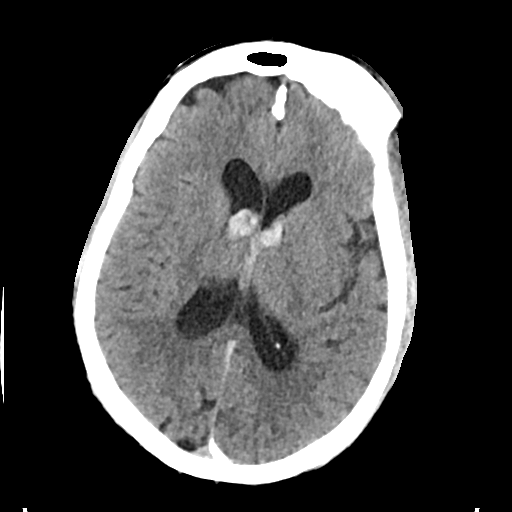
[im 22/36  brain]
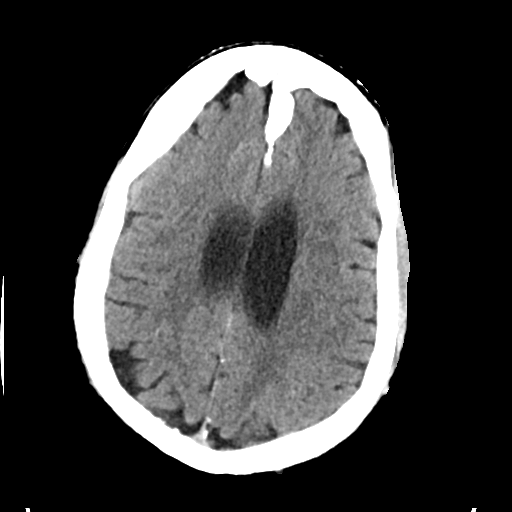
[im 22/36  bone]
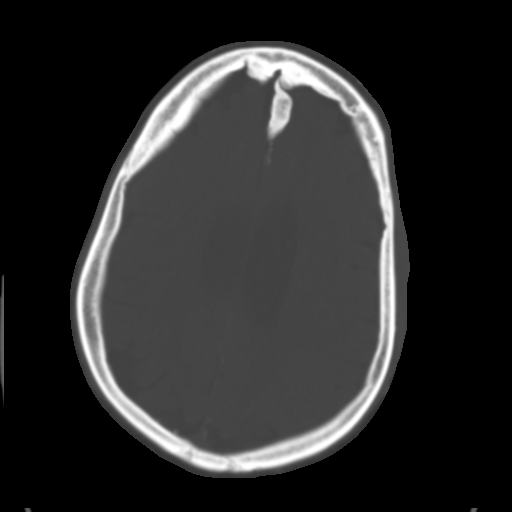
[im 27/36  brain]
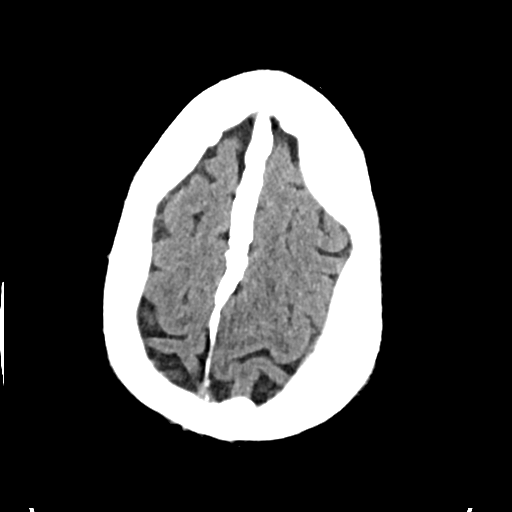
[im 31/36  brain]
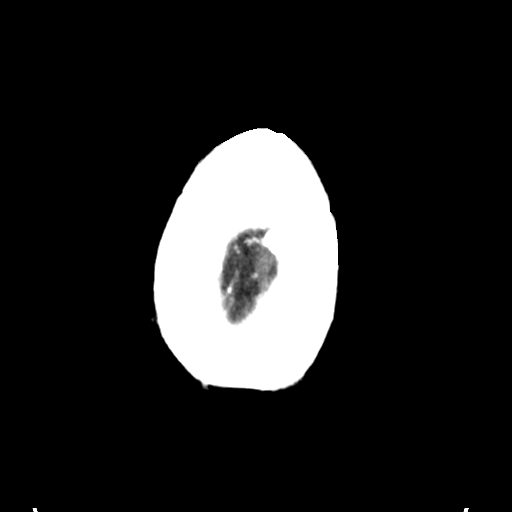

[Series 4: head bone · axial · 0.44mm/px · z∈[-130,-94]mm · 3 of 90 slices shown]
[im 9/90  bone]
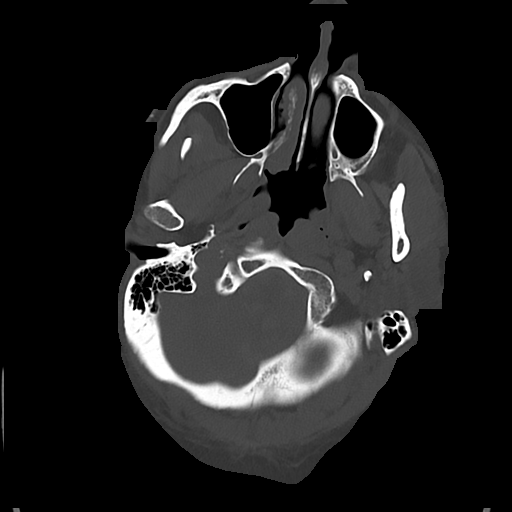
[im 18/90  bone]
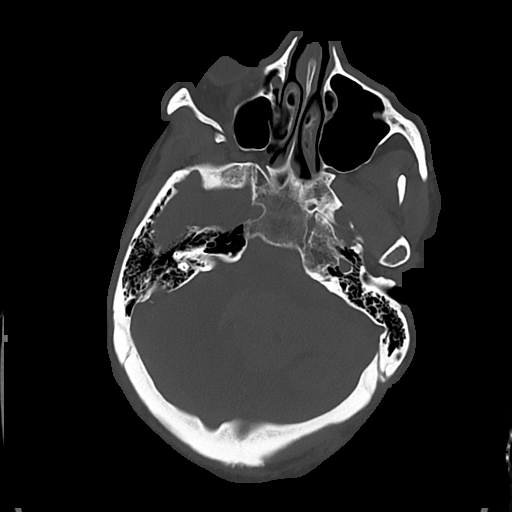
[im 27/90  bone]
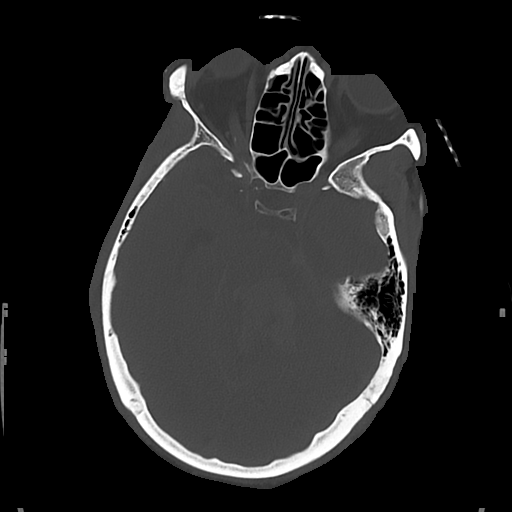

[Series 5: cor soft · coronal · 0.34mm/px · 3 of 76 slices shown]
[im 26/76  brain]
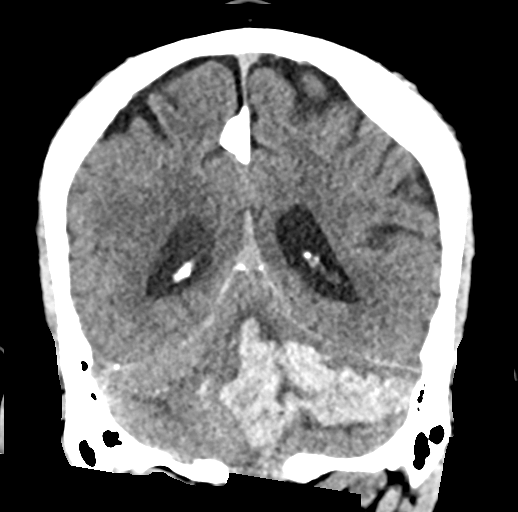
[im 34/76  brain]
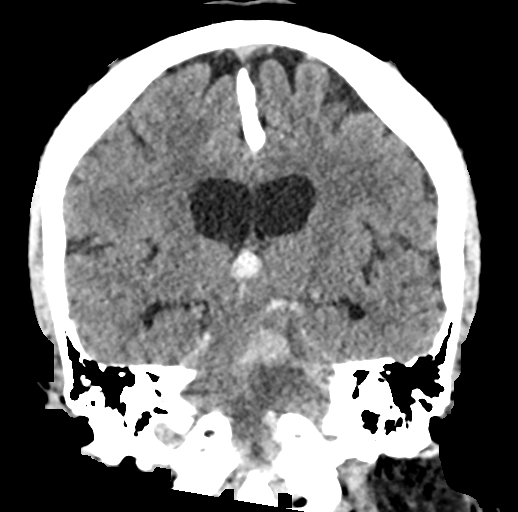
[im 42/76  brain]
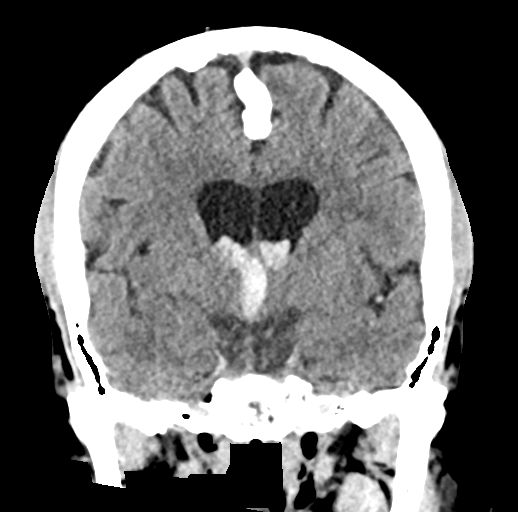

[Series 6: sag soft · sagittal · 0.34mm/px · 3 of 57 slices shown]
[im 22/57  brain]
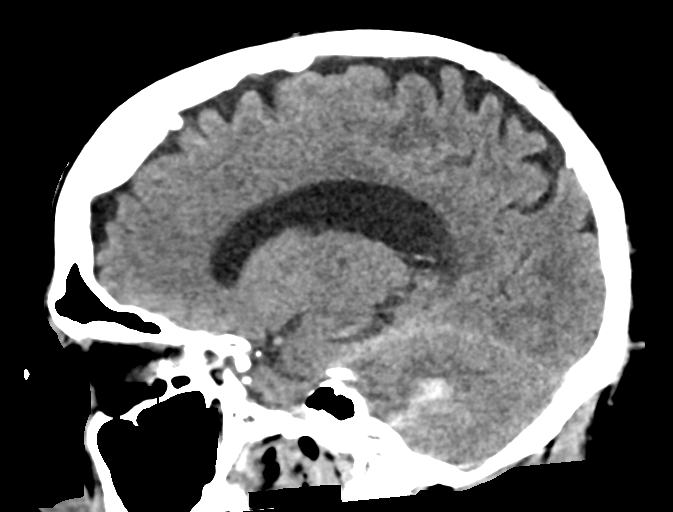
[im 29/57  brain]
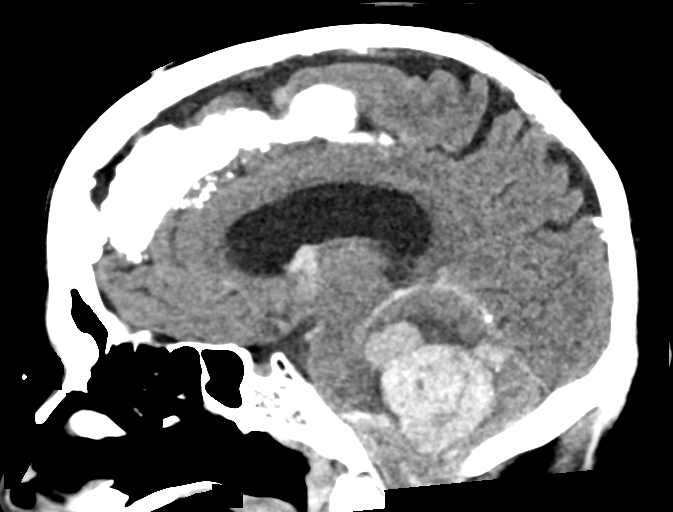
[im 35/57  brain]
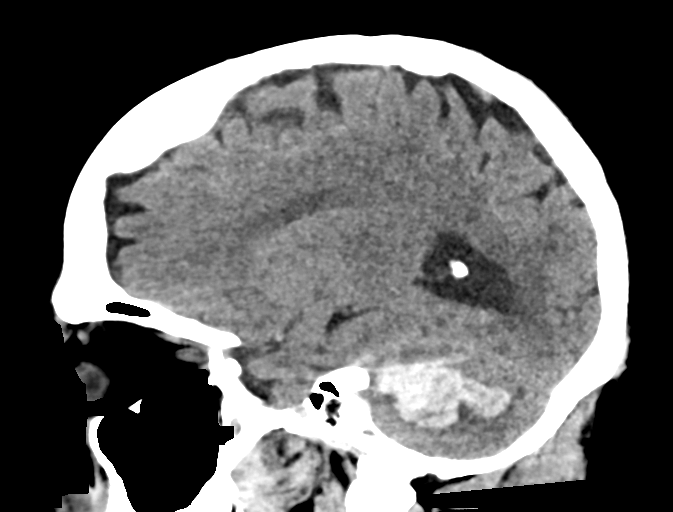

[16 of 47 positions shown; findings below may reference images not displayed]

FINDINGS: Brain: Large area of acute hemorrhage in the left cerebellum
measuring 4 x 6 cm. There is extension into the fourth ventricle and
spread of blood into the third and lateral ventricles. There is mild
obstructive hydrocephalus. There is mass-effect on the brainstem due
to the hematoma.

No acute ischemic infarct.  No mass lesion identified.

Vascular: Negative for hyperdense vessel

Skull: Negative

Sinuses/Orbits: None

Other:

ASPECTS ([place] Stroke Program Early CT Score)

-not applicable due to hemorrhage.
IMPRESSION: 1. 4 x 6 cm acute hematoma left cerebellum with extension into the
ventricles. There is mass-effect on the brainstem and obstructive
mild hydrocephalus.

These results were called by telephone at the time of interpretation
on 03/30/2020 at [DATE] to provider Apolo, who verbally acknowledged
these results.

## 2020-09-26 IMAGING — DX DG CHEST 1V PORT
1 series · 1 of 1 positions shown · non-contrast
Comparison: None.

CLINICAL DATA: Intubation

EXAM:
PORTABLE CHEST 1 VIEW

[chest ap]
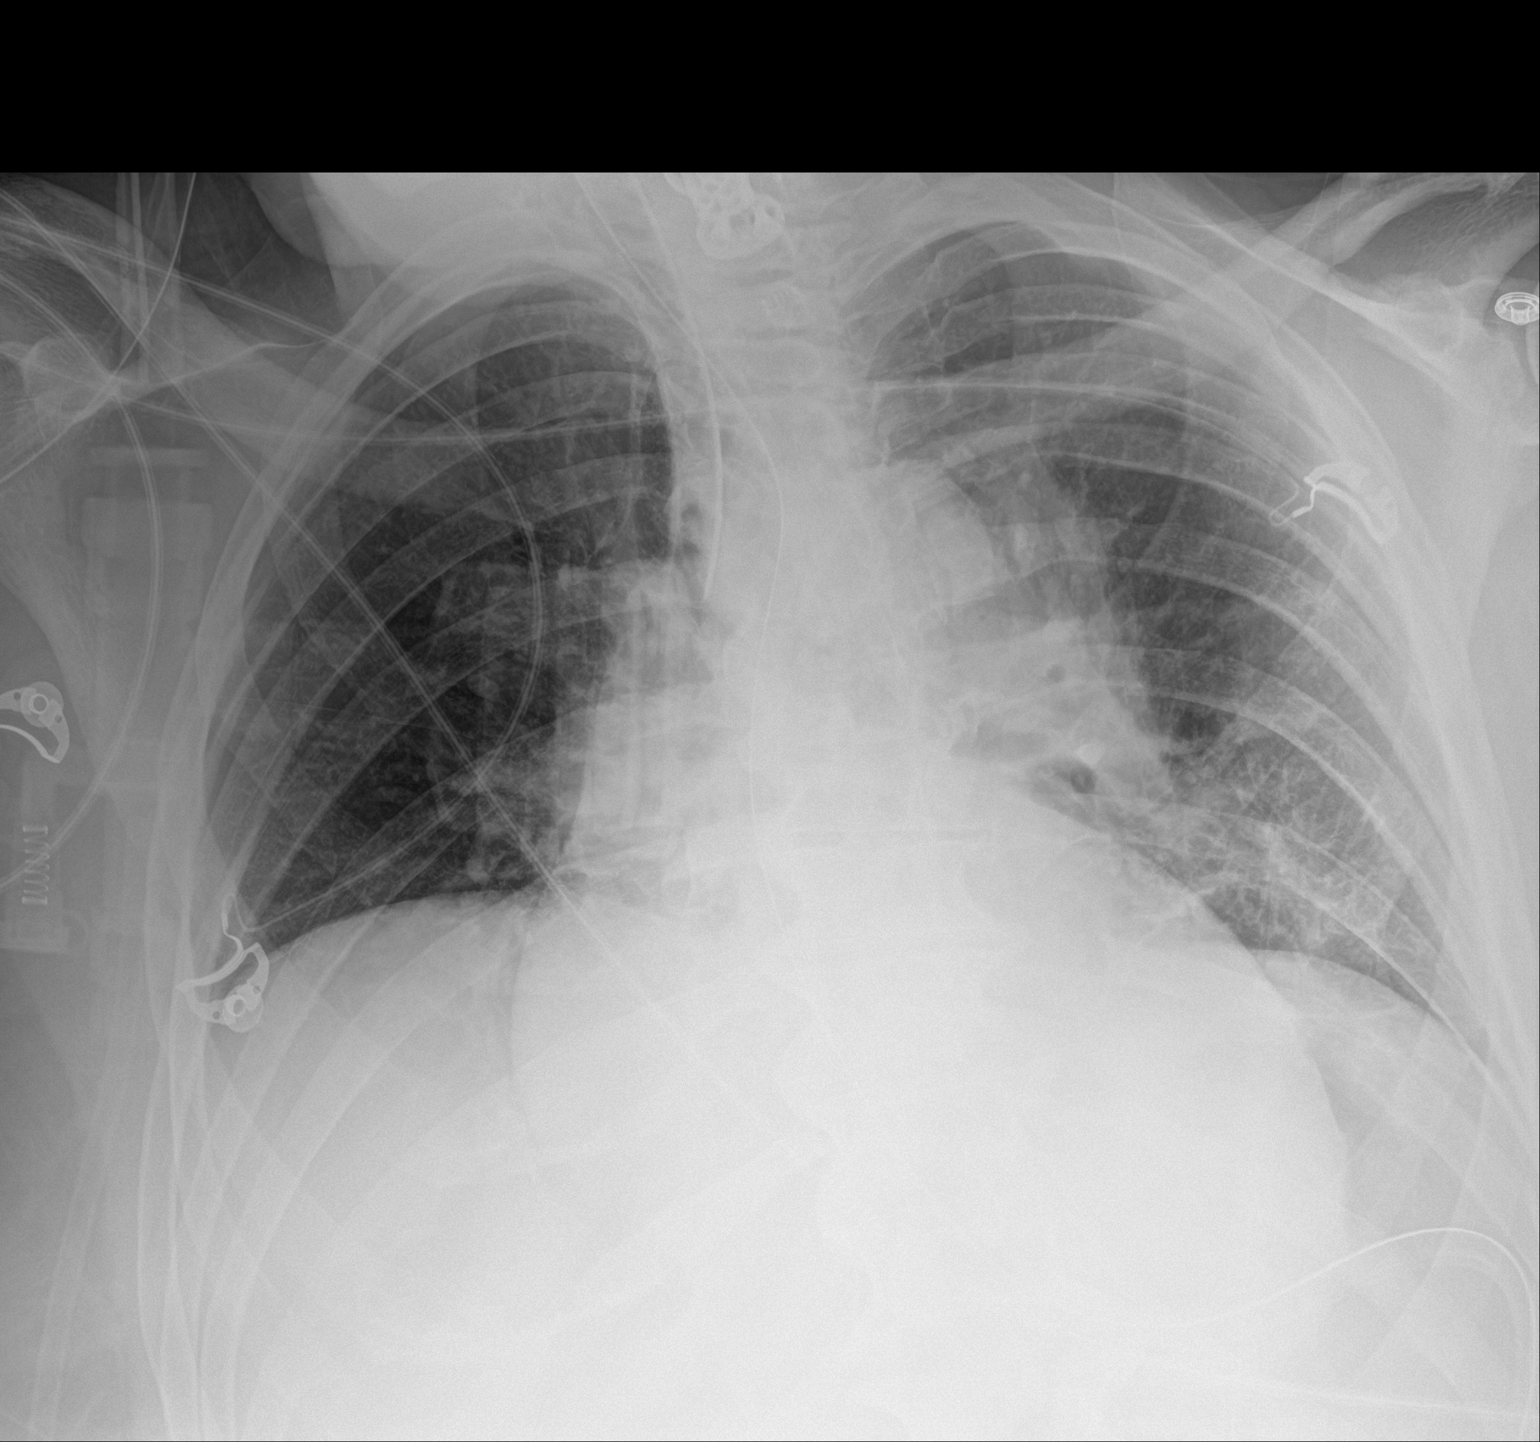

[1 of 1 positions shown; findings below may reference images not displayed]

FINDINGS: Mild cardiomegaly. Hazy left parahilar opacities. The endotracheal
tube tip is just above the carina. Enteric tube tip and side port
are beyond the field of view.
IMPRESSION: 1. Endotracheal tube tip just above the carina.
2. Hazy left parahilar opacities, possibly pneumonia.
